# Patient Record
Sex: Male | Born: 1955 | Race: White | Hispanic: No | Marital: Married | Smoking: Former smoker
Health system: Southern US, Community
[De-identification: ages and names within clinical notes are randomized; demographics above are authoritative.]

## PROBLEM LIST (undated history)

## (undated) DIAGNOSIS — E7800 Pure hypercholesterolemia, unspecified: Secondary | ICD-10-CM

## (undated) DIAGNOSIS — C140 Malignant neoplasm of pharynx, unspecified: Secondary | ICD-10-CM

## (undated) DIAGNOSIS — Z8619 Personal history of other infectious and parasitic diseases: Secondary | ICD-10-CM

## (undated) DIAGNOSIS — K219 Gastro-esophageal reflux disease without esophagitis: Secondary | ICD-10-CM

## (undated) HISTORY — PX: COLONOSCOPY WITH PROPOFOL: SHX5780

## (undated) HISTORY — DX: Malignant neoplasm of pharynx, unspecified: C14.0

## (undated) HISTORY — DX: Personal history of other infectious and parasitic diseases: Z86.19

---

## 2009-02-16 ENCOUNTER — Ambulatory Visit: Payer: Self-pay | Admitting: Gastroenterology

## 2009-09-06 ENCOUNTER — Emergency Department: Payer: Self-pay | Admitting: Unknown Physician Specialty

## 2009-09-06 IMAGING — CR DG LUMBAR SPINE 2-3V
1 series · 3 of 3 positions shown · non-contrast
Comparison: none

REASON FOR EXAM: fell out of tree    injury  Flex 5
COMMENTS:   LMP: (Male)

[Series 1: view not recorded · 0.17mm/px · 3 of 3 slices shown]
[im 1/3]
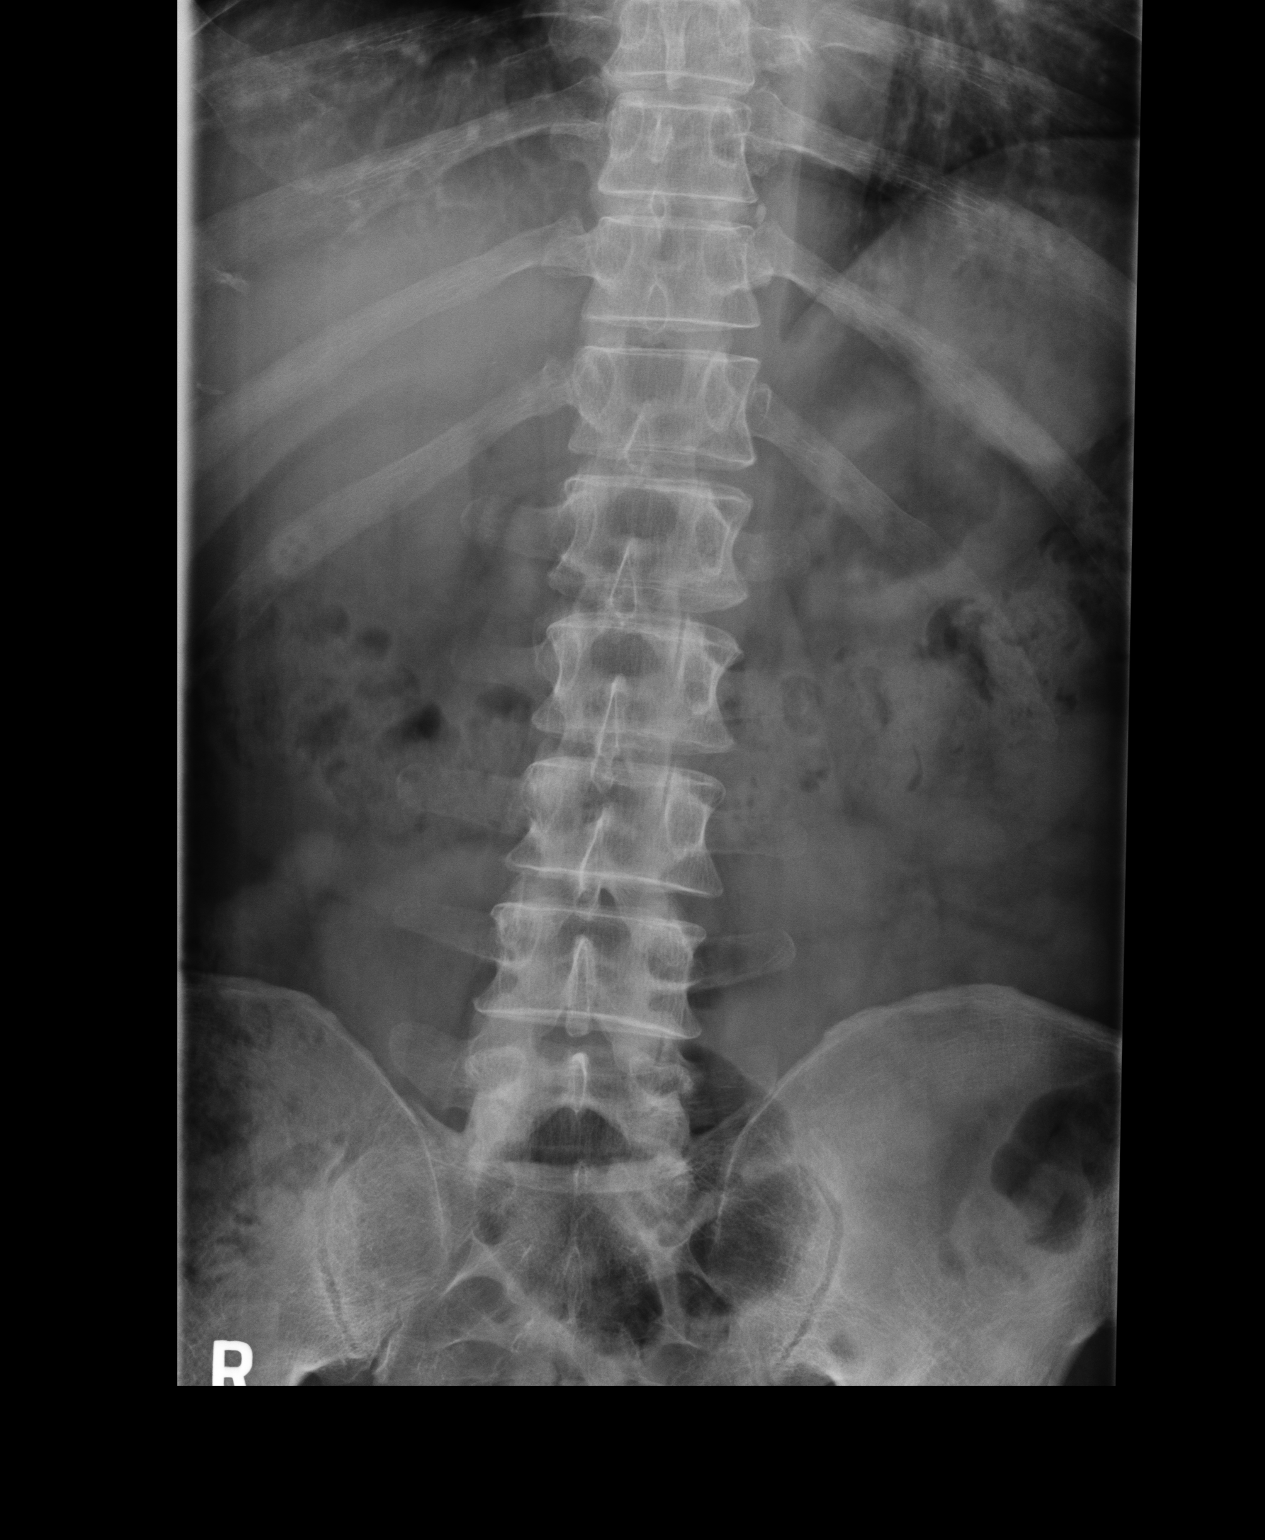
[im 2/3]
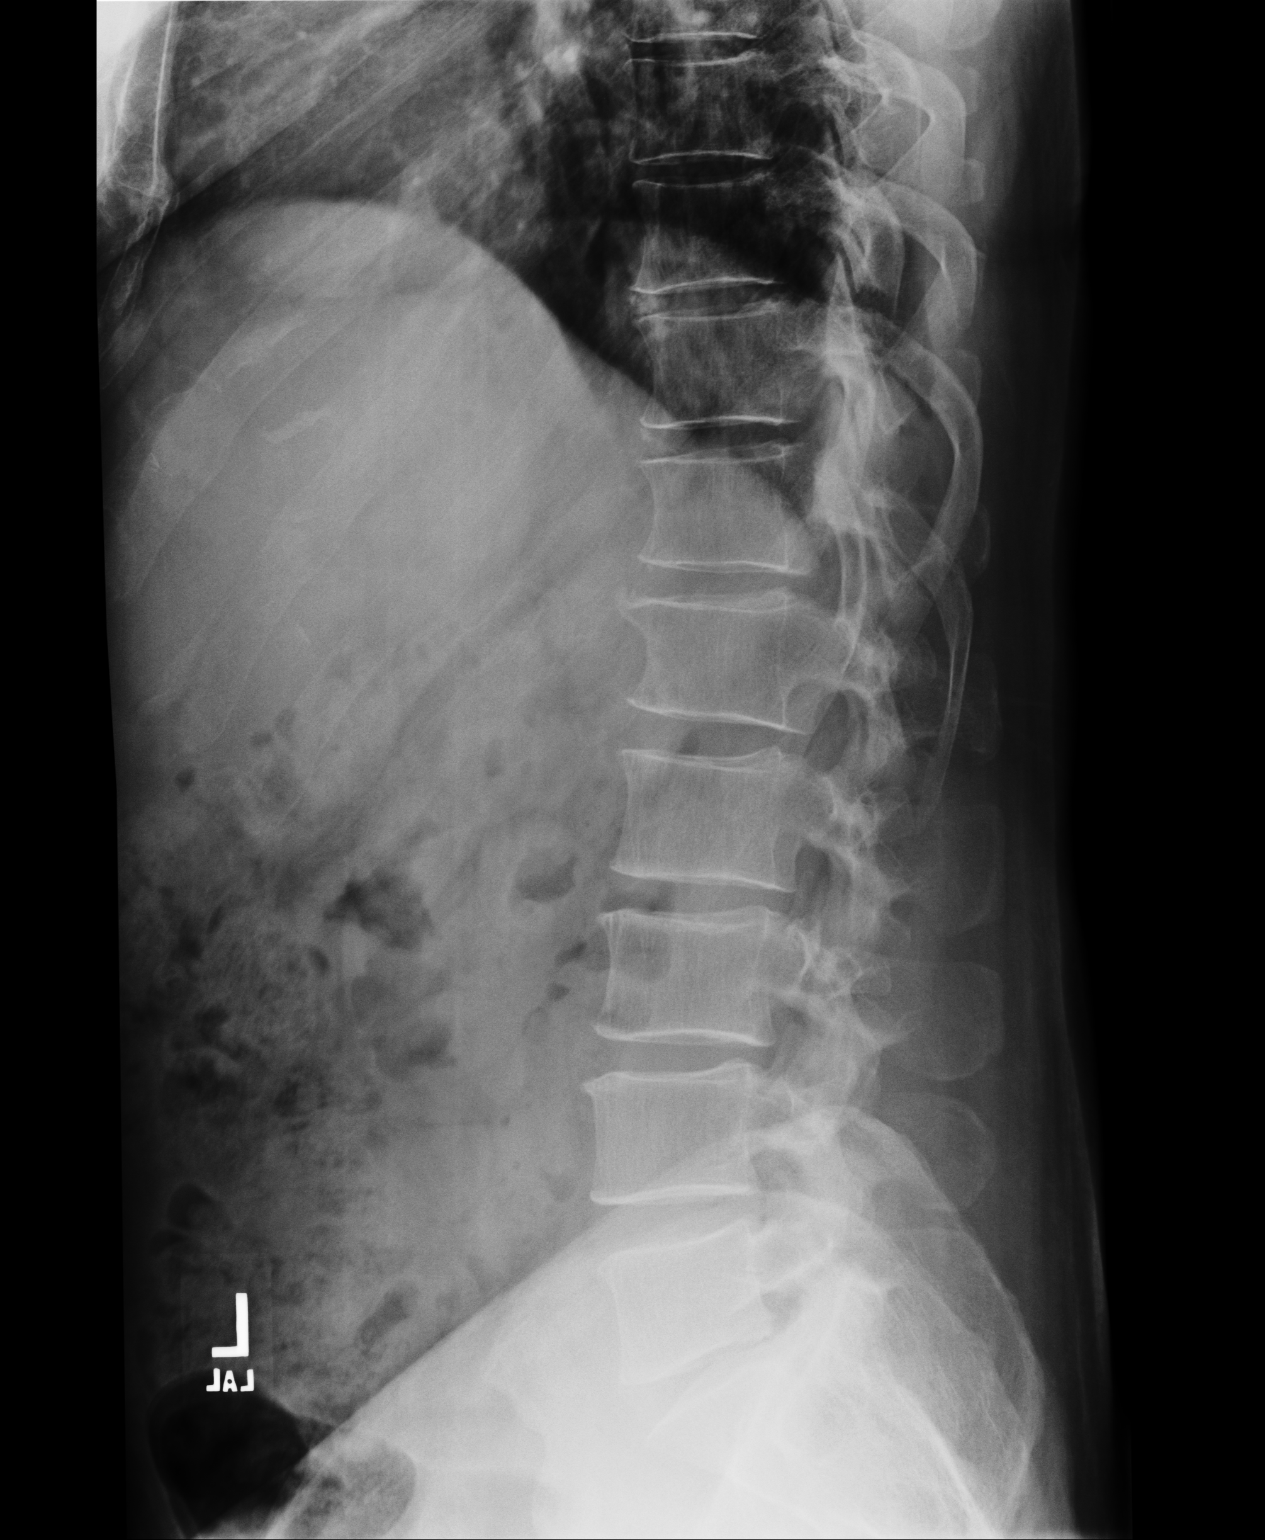
[im 3/3]
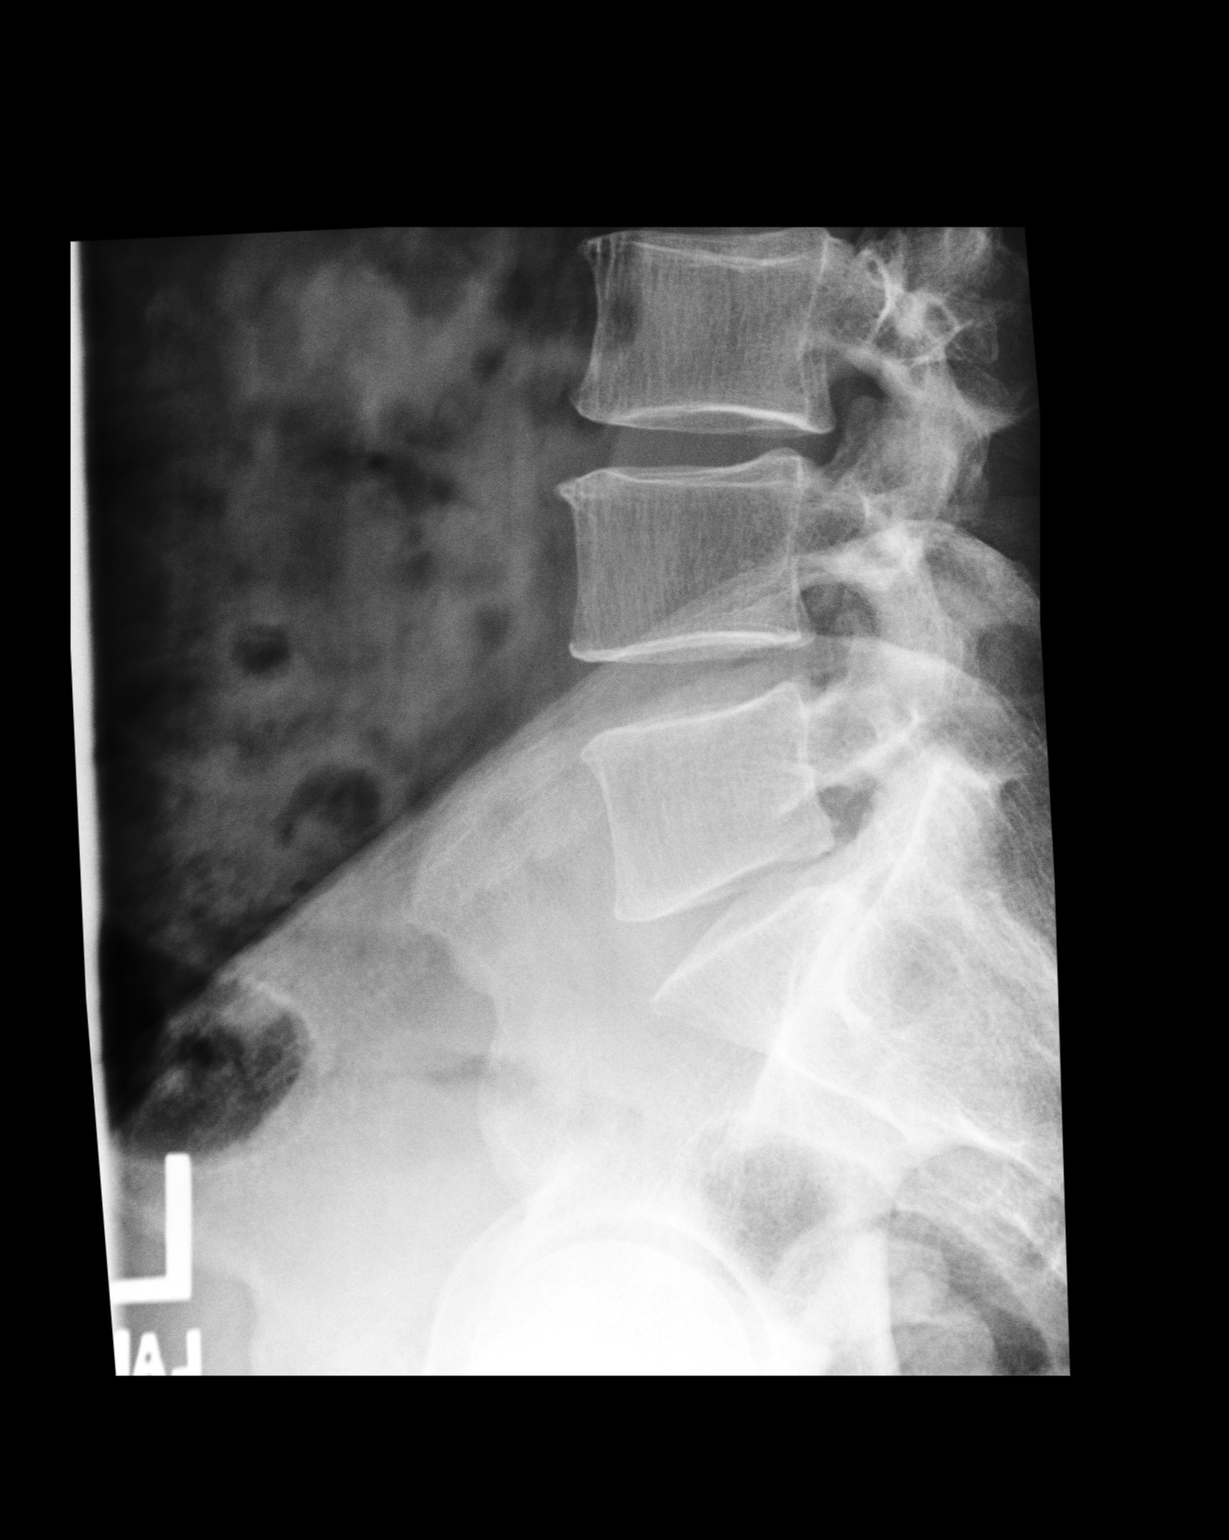

[3 of 3 positions shown; findings below may reference images not displayed]

PROCEDURE:     DXR - DXR LUMBAR SPINE AP AND LATERAL  - [DATE] [DATE]

RESULT:     The lumbar vertebral bodies are preserved in height with the
exception of L1 where there is very minimal anterior wedging. The
intervertebral disc space heights are well-maintained. I see no evidence of
retropulsed bony fragments.
IMPRESSION: There is very minimal wedging of the body of L1 anteriorly.
The examination otherwise is within the limits of normal for age.

## 2009-09-06 IMAGING — CR DG SHOULDER 3+V*R*
1 series · 3 of 3 positions shown · non-contrast
Comparison: none

REASON FOR EXAM: fell out of tree   injury   Flex 5
COMMENTS:   LMP: (Male)

[Series 1: view not recorded · 0.17mm/px · 3 of 3 slices shown]
[im 1/3]
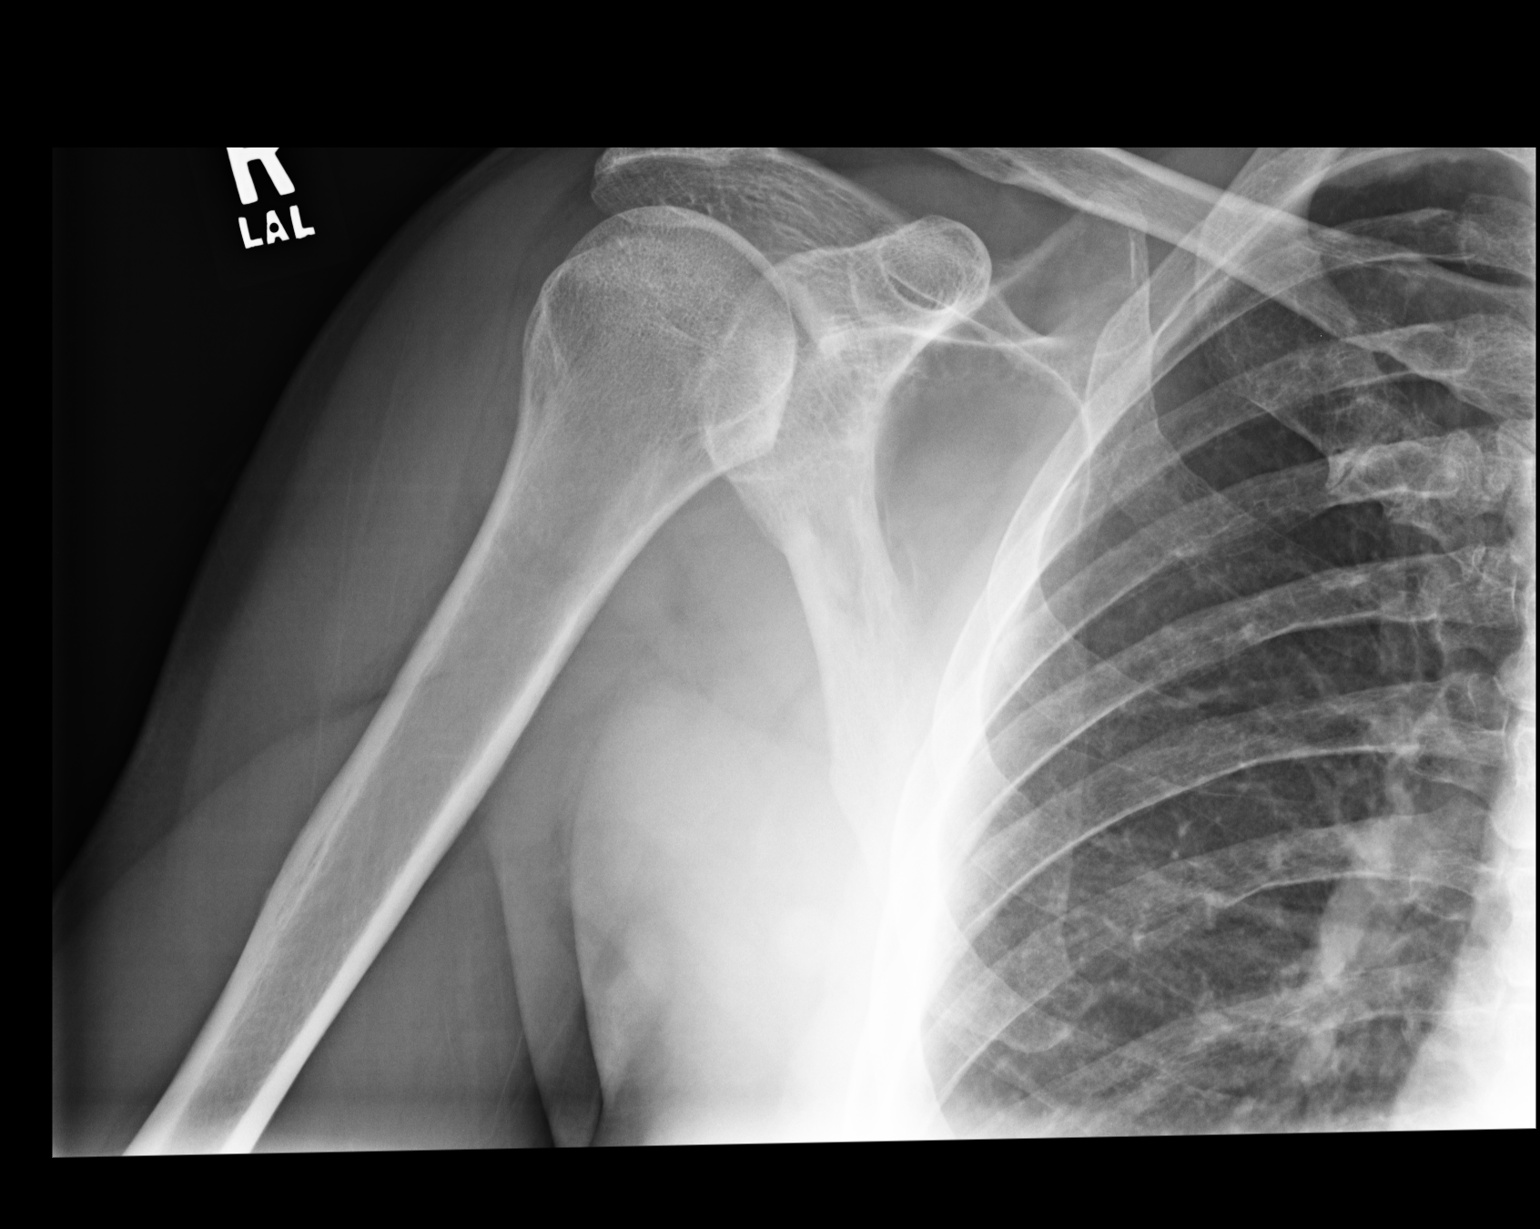
[im 2/3]
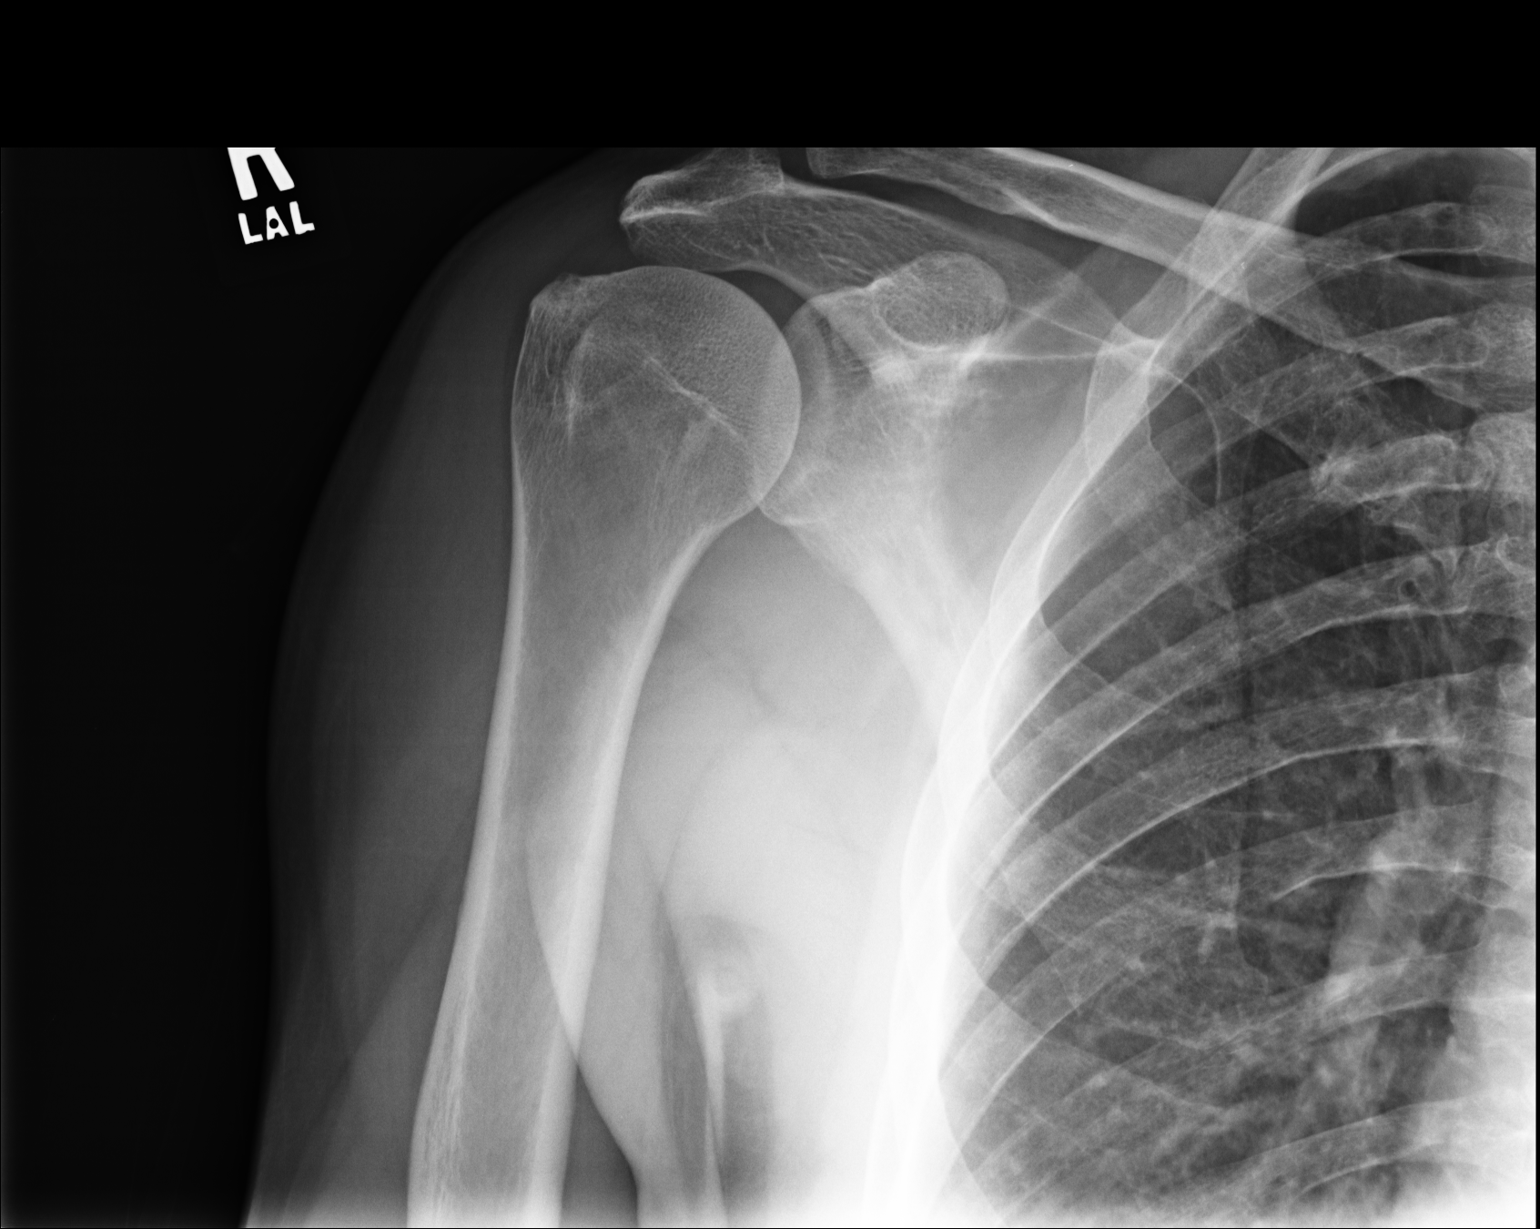
[im 3/3]
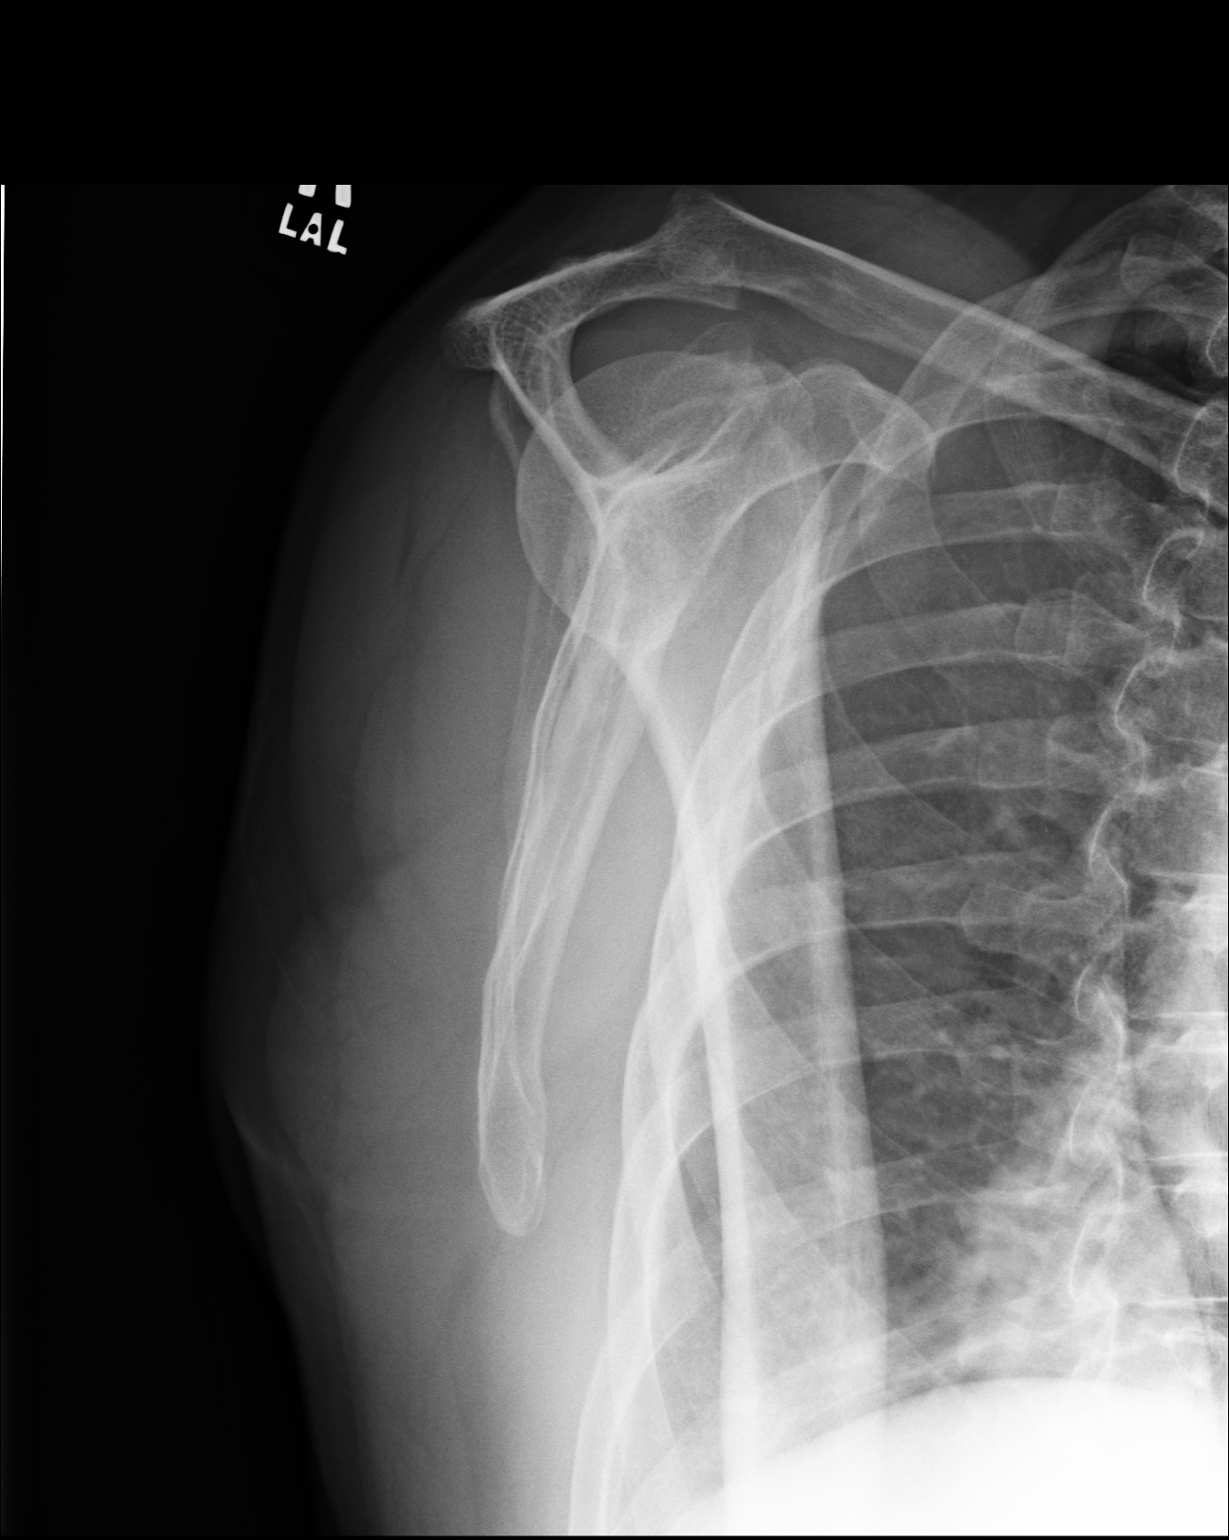

[3 of 3 positions shown; findings below may reference images not displayed]

PROCEDURE:     DXR - DXR SHOULDER RIGHT COMPLETE  - [DATE] [DATE]

RESULT:     Three views of the right shoulder reveal the bones to be
adequately mineralized. There is no evidence of an acute fracture nor
dislocation. The AC joint does not appear abnormally widened but mild
degenerative change of the AC joint is noted. The observed portions of the
scapula and clavicle appear intact.
IMPRESSION: I do not see evidence of acute fracture of the right
shoulder. If there are clinical concerns of AC joint injury, films obtained
without and with weights would be of value.

## 2009-09-06 IMAGING — CR DG WRIST COMPLETE 3+V*R*
1 series · 4 of 4 positions shown · non-contrast
Comparison: none

REASON FOR EXAM: fell out of tree/ injury   Flex 5
COMMENTS:   LMP: (Male)

[Series 1: view not recorded · 0.17mm/px · 4 of 4 slices shown]
[im 1/4]
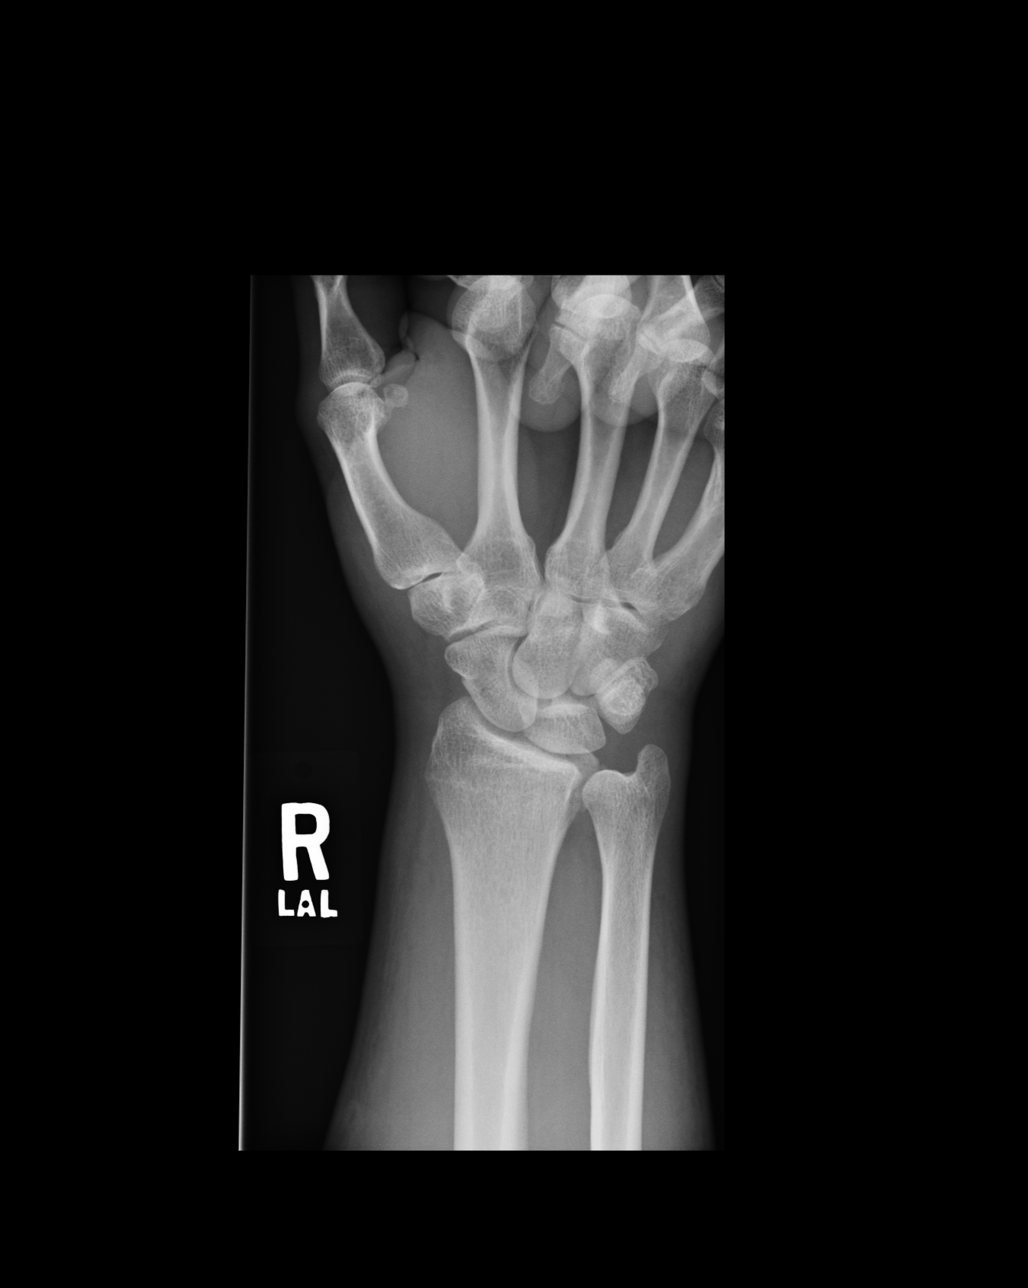
[im 2/4]
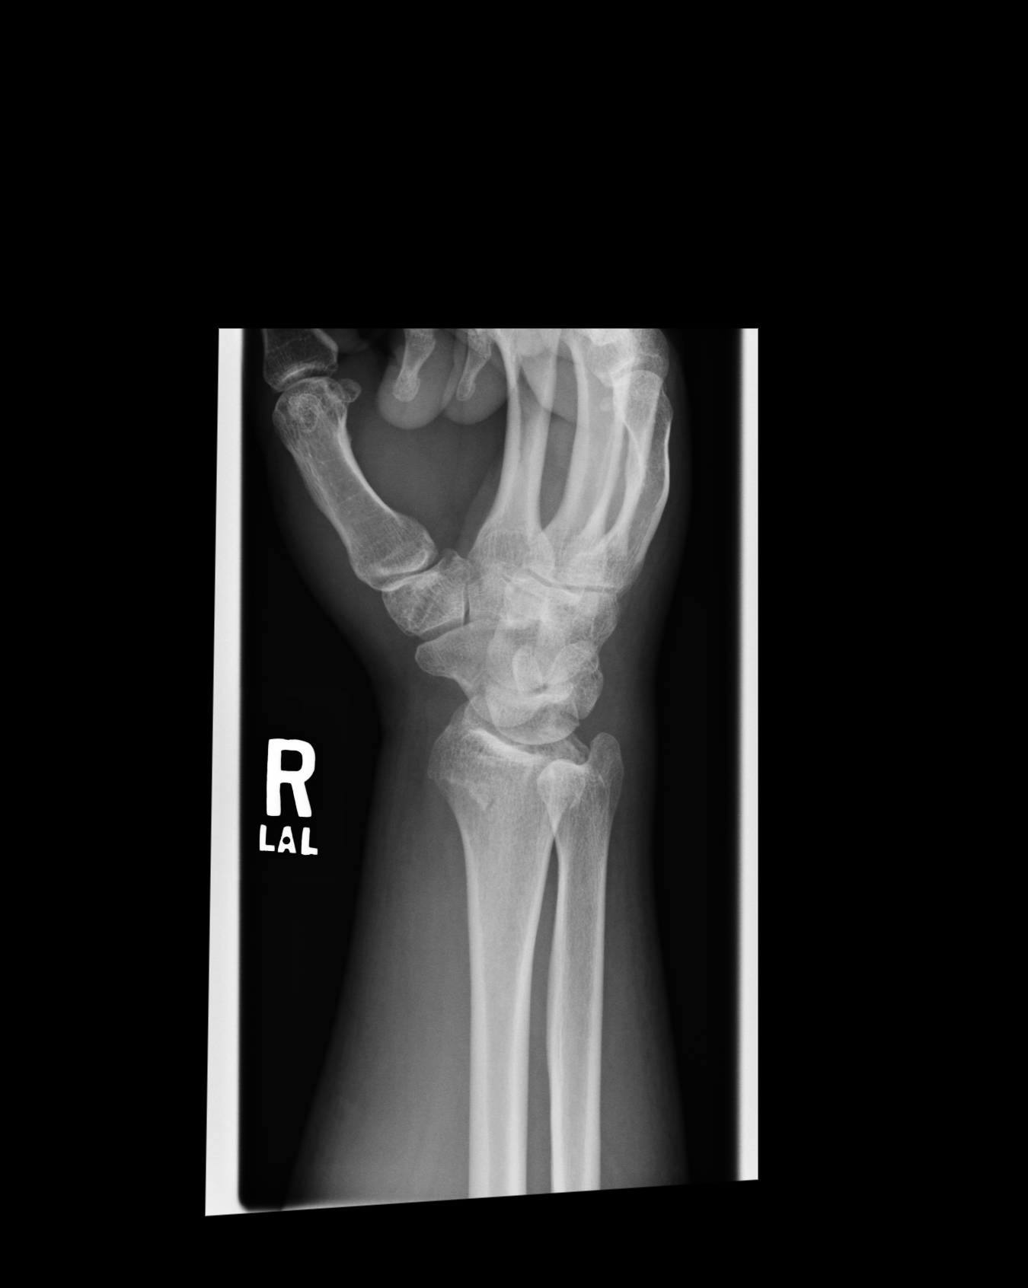
[im 3/4]
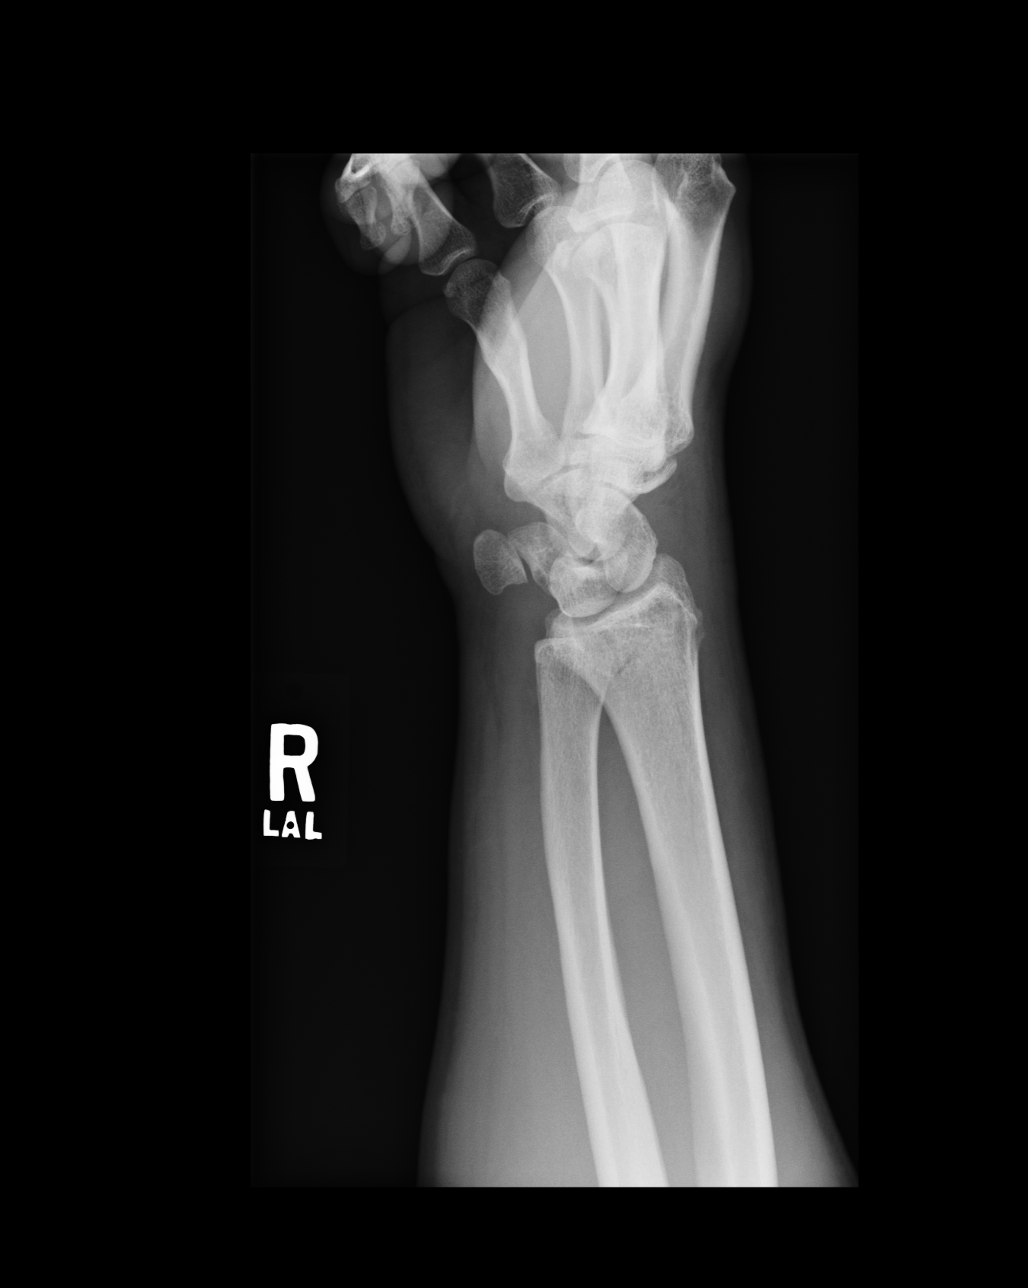
[im 4/4]
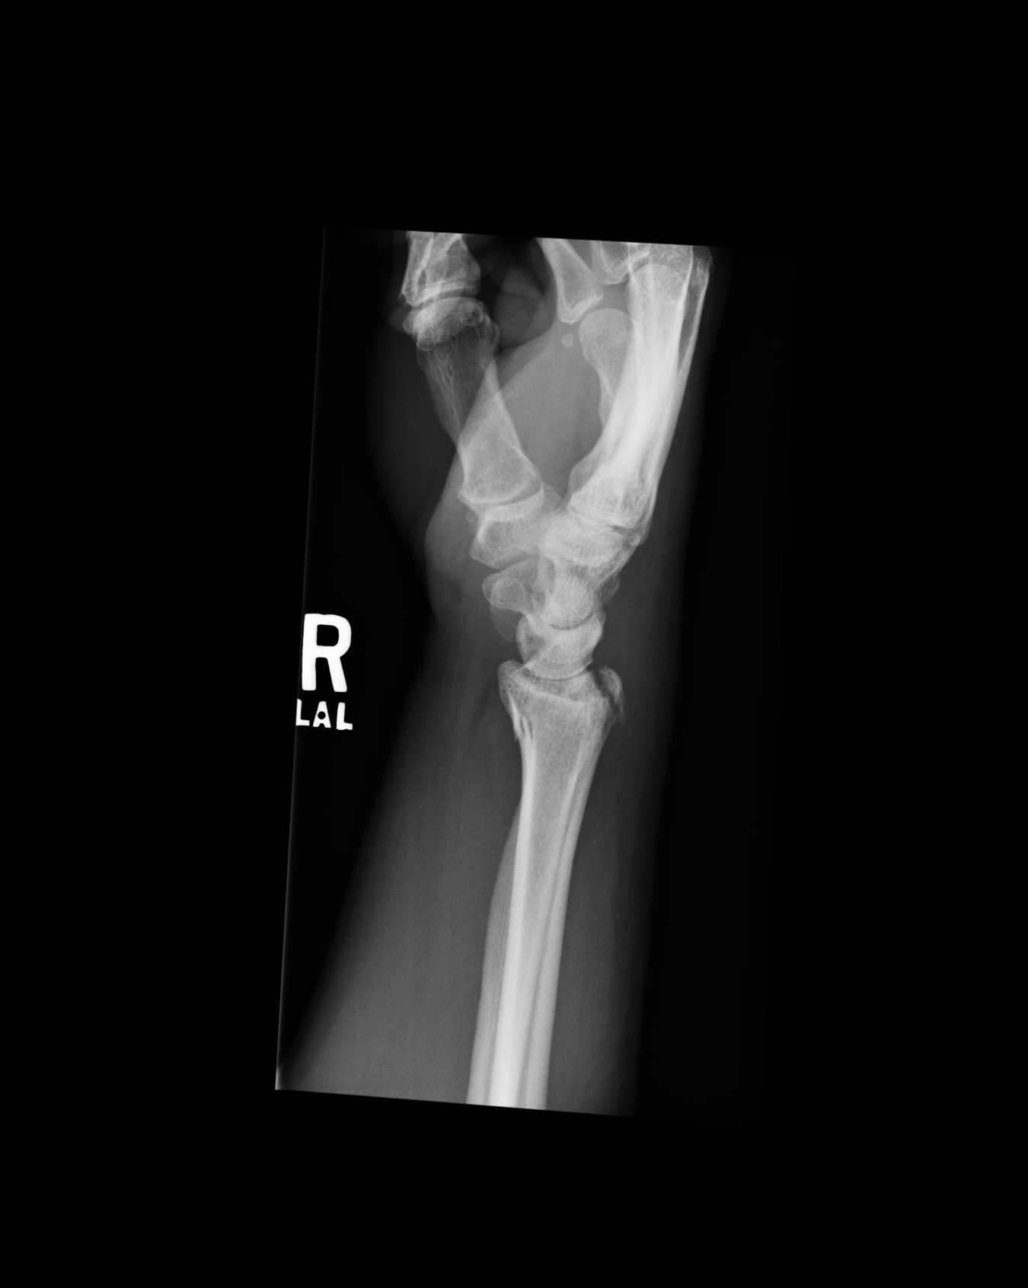

[4 of 4 positions shown; findings below may reference images not displayed]

PROCEDURE:     DXR - DXR WRIST RT COMP WITH OBLIQUES  - [DATE] [DATE]

RESULT:     Four views of the right wrist reveal a fracture involving the
distal radial metaphysis. There is no definite fracture of the adjacent
ulna. The carpal bones are grossly intact. There is an old fracture of the
base of the fifth metacarpal.
IMPRESSION: The patient has sustained a fracture through the distal
radial metaphysis. It is comminuted and there is a avulsion of a portion of
the dorsal aspect of the metaphysis.

## 2012-01-08 IMAGING — CR RIGHT FOOT COMPLETE - 3+ VIEW
1 series · 3 of 3 positions shown · non-contrast
Comparison: none

REASON FOR EXAM: painful, swelling, fall
COMMENTS:

PROCEDURE:     DXR - DXR FOOT RT COMPLETE W/OBLIQUES  - [DATE]  [DATE]
RESULT:     Images of the right foot demonstrate no evidence of fracture,
dislocation or radiopaque foreign body.

[Series 1: x foot ap right · 0.14mm/px · 3 of 3 slices shown]
[im 1/3]
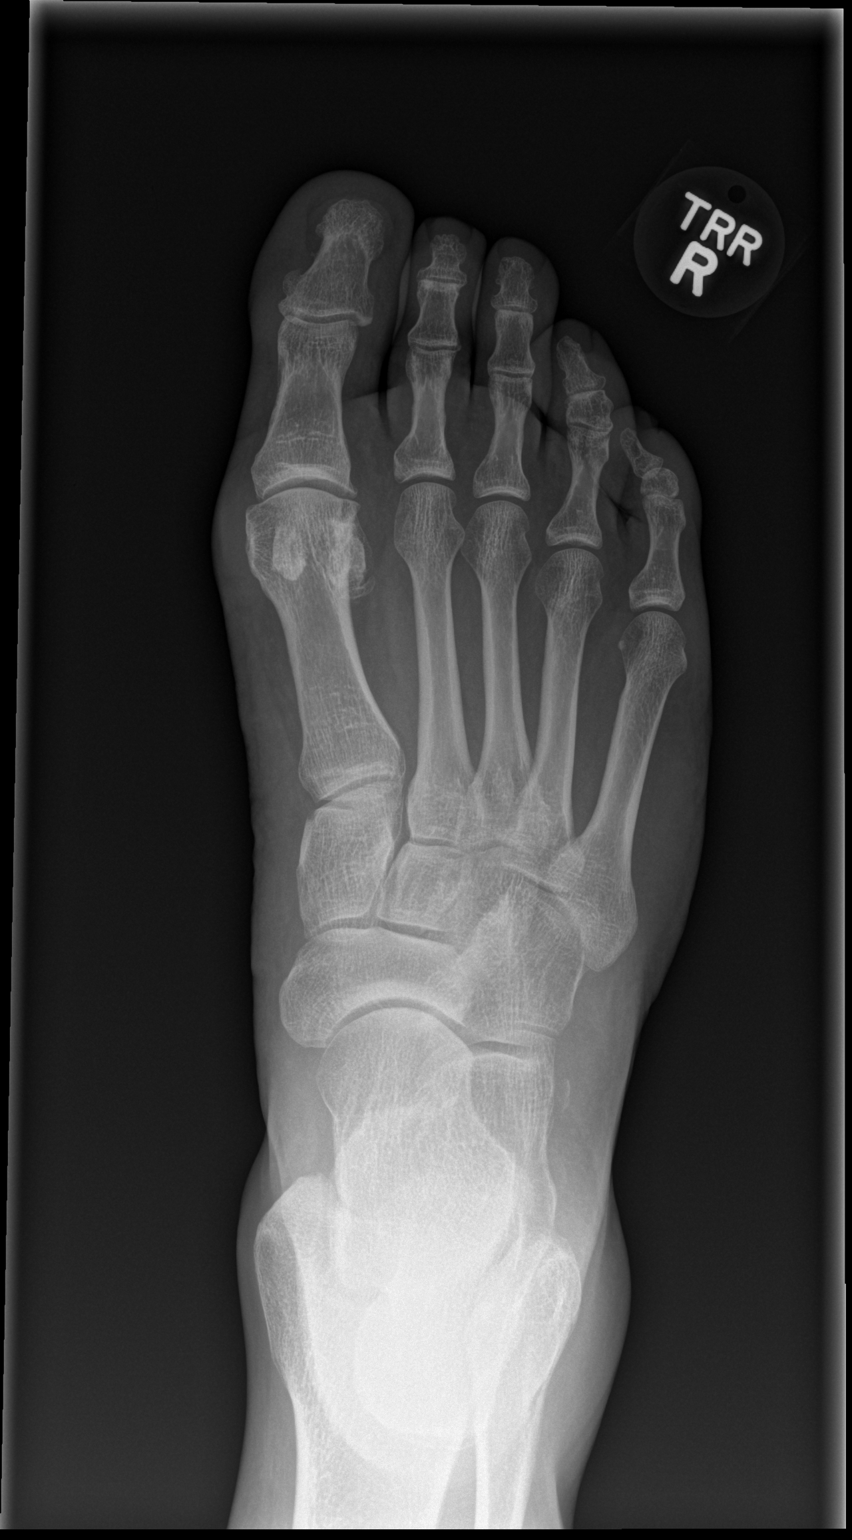
[im 2/3]
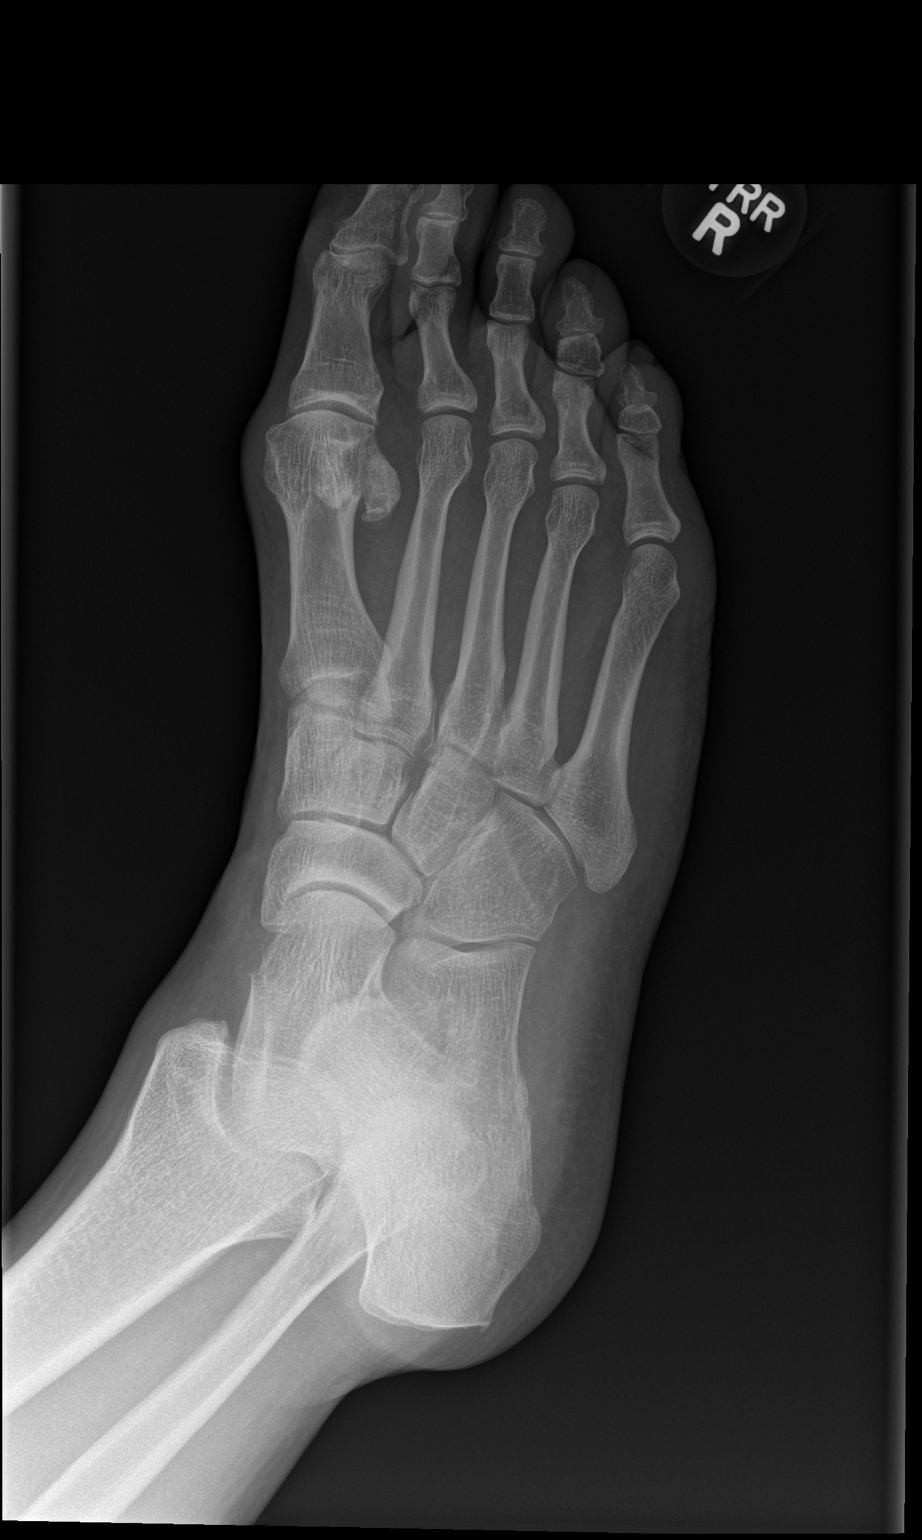
[im 3/3]
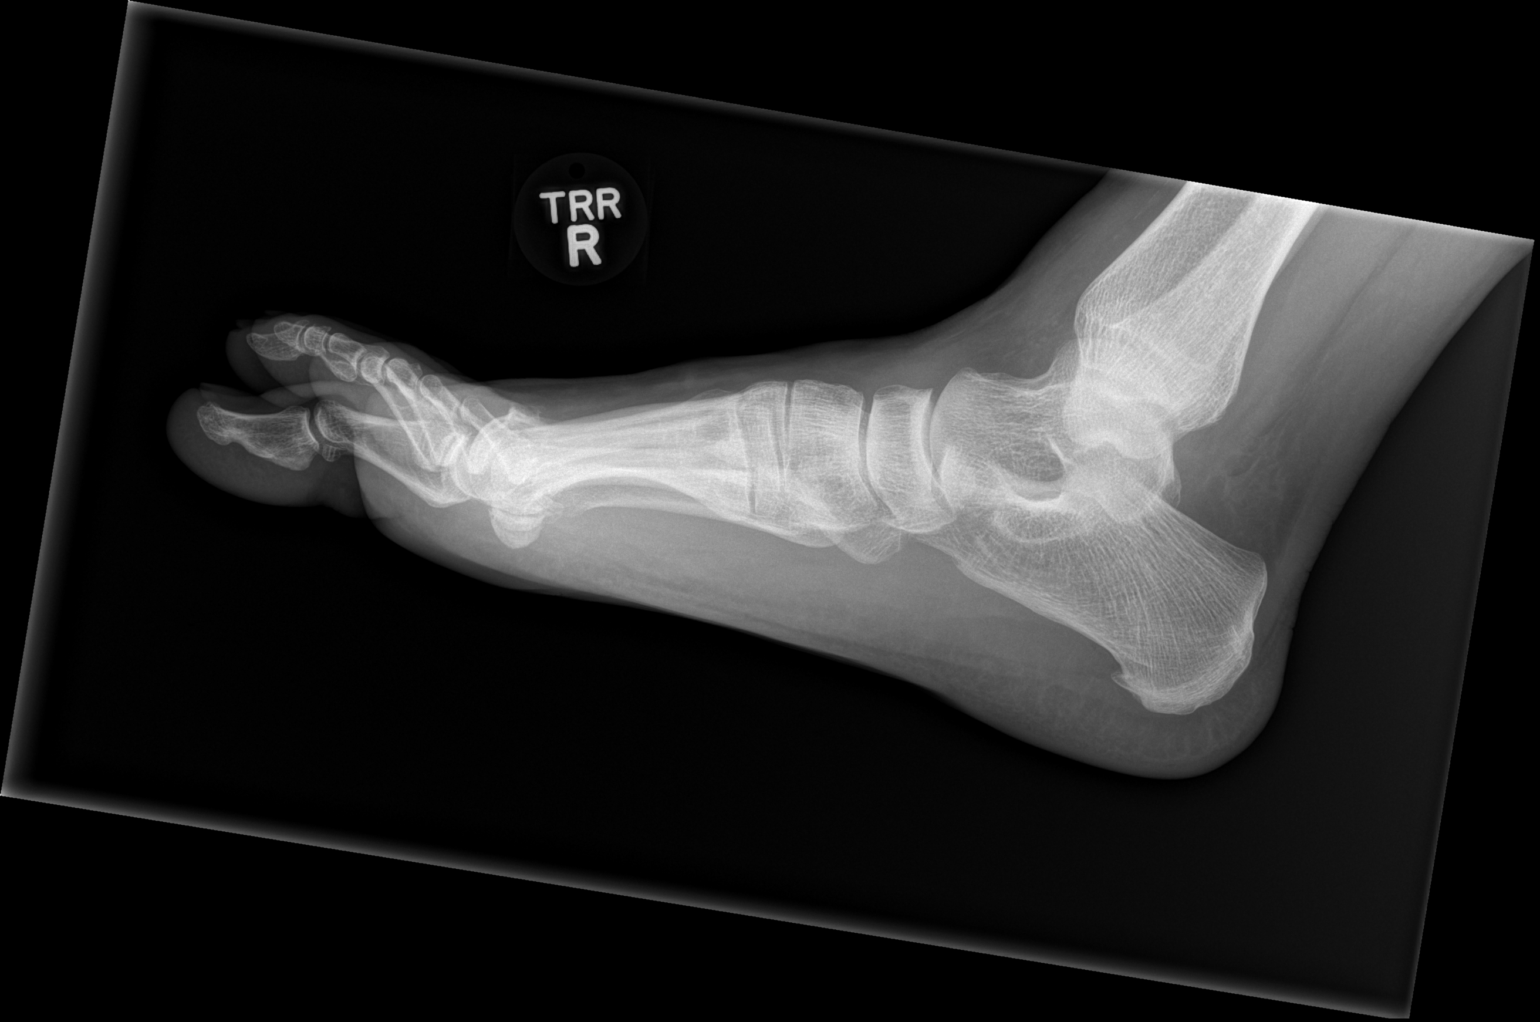

[3 of 3 positions shown; findings below may reference images not displayed]

IMPRESSION: Please see above.

[REDACTED]

## 2012-12-07 ENCOUNTER — Emergency Department: Payer: Self-pay | Admitting: Emergency Medicine

## 2012-12-07 IMAGING — CR RIGHT ANKLE - 2 VIEW
1 series · 2 of 2 positions shown · non-contrast
Comparison: none

REASON FOR EXAM: stepped in hole, pain swelling
COMMENTS:

PROCEDURE:     DXR - DXR ANKLE RIGHT AP AND LATERAL  - [DATE] [DATE]
RESULT:     Right ankle images demonstrate no fracture, dislocation or
radiopaque foreign body. There is some degenerative spurring in the plantar
region of the calcaneus.

[Series 1: ap · 0.17mm/px · 2 of 2 slices shown]
[im 1/2]
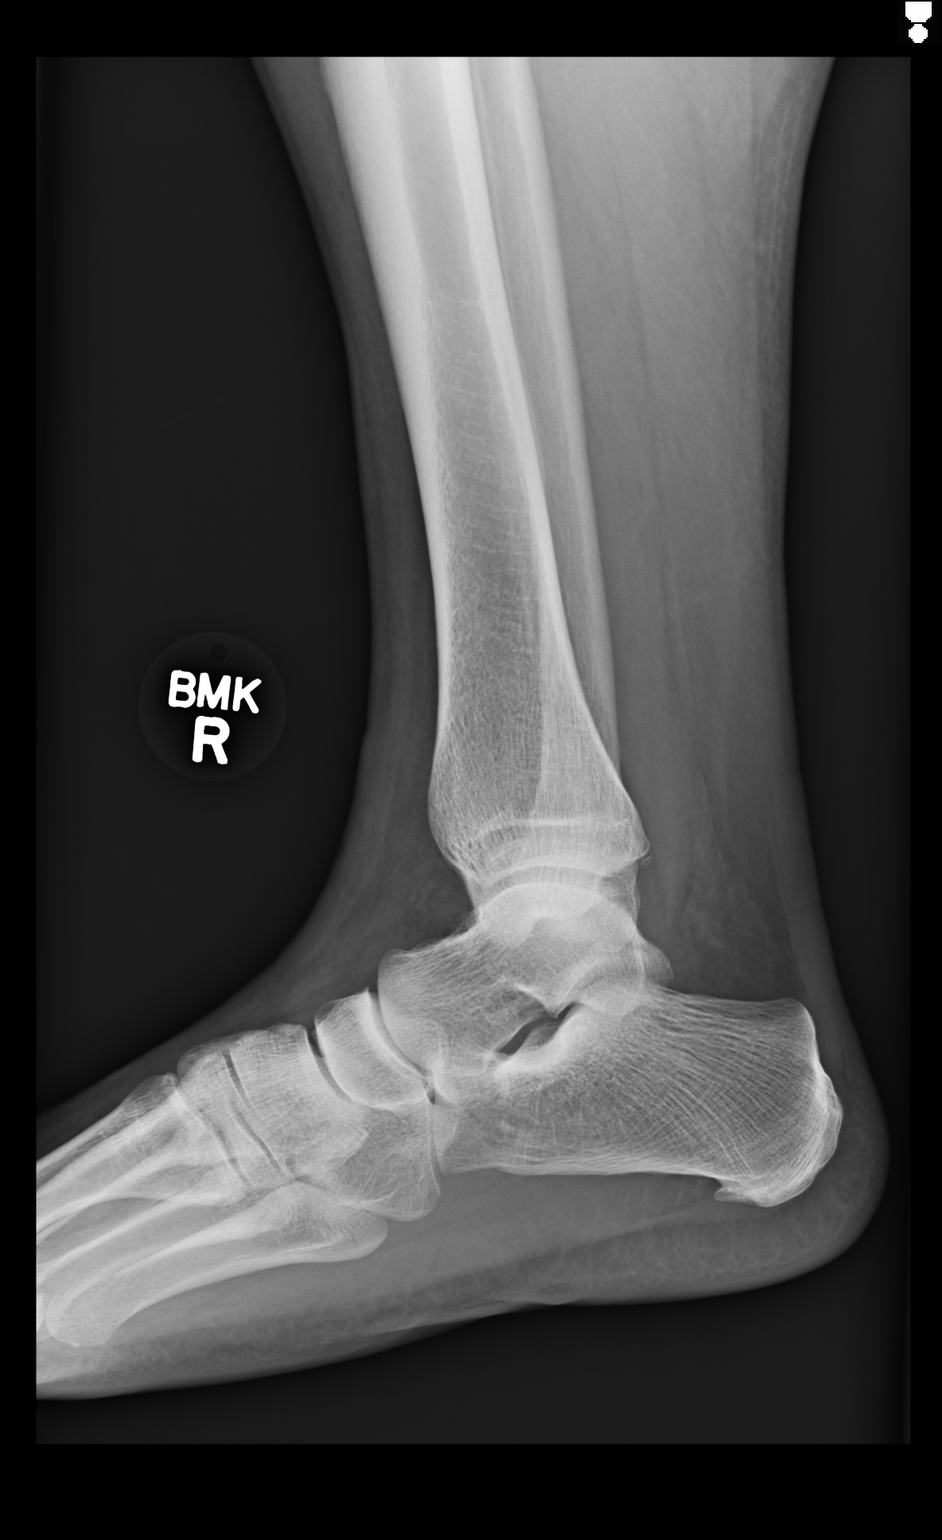
[im 2/2]
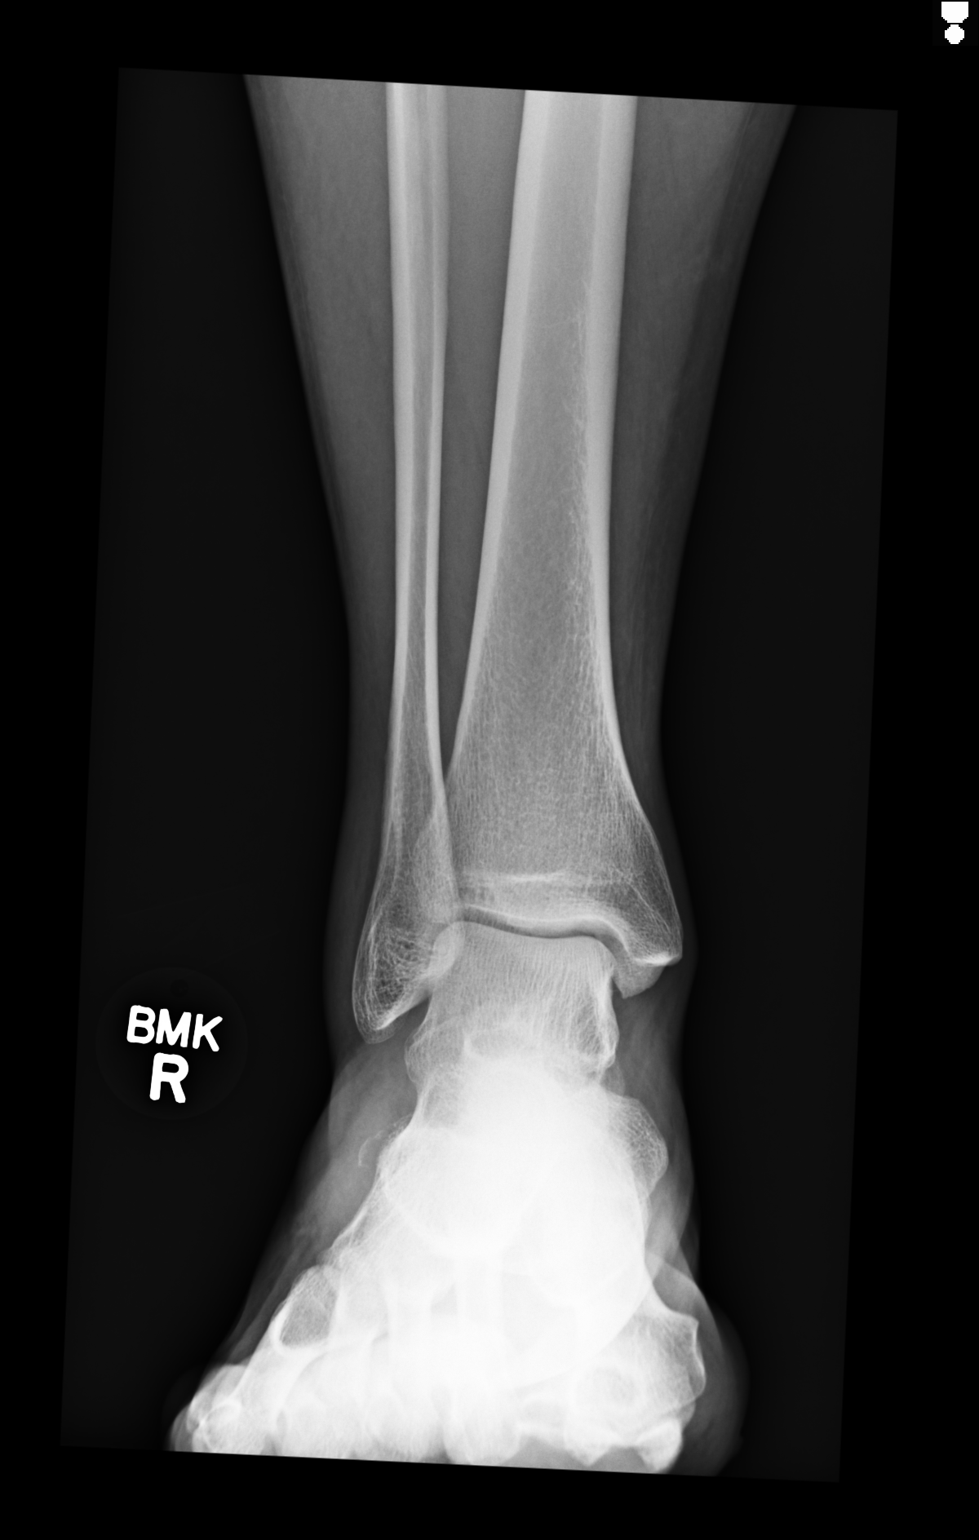

[2 of 2 positions shown; findings below may reference images not displayed]

IMPRESSION: Please see above.

[REDACTED]

## 2013-02-19 ENCOUNTER — Encounter: Payer: Self-pay | Admitting: *Deleted

## 2013-02-22 ENCOUNTER — Encounter: Payer: Self-pay | Admitting: Cardiovascular Disease

## 2013-02-22 ENCOUNTER — Ambulatory Visit (INDEPENDENT_AMBULATORY_CARE_PROVIDER_SITE_OTHER): Payer: BC Managed Care – PPO | Admitting: Cardiovascular Disease

## 2013-02-22 VITALS — BP 114/84 | HR 48 | Ht 68.5 in | Wt 184.2 lb

## 2013-02-22 DIAGNOSIS — R0602 Shortness of breath: Secondary | ICD-10-CM

## 2013-02-22 DIAGNOSIS — R079 Chest pain, unspecified: Secondary | ICD-10-CM | POA: Insufficient documentation

## 2013-02-22 DIAGNOSIS — R000 Tachycardia, unspecified: Secondary | ICD-10-CM

## 2013-02-22 NOTE — Patient Instructions (Addendum)
[facility] MYOVIEW  Your caregiver has ordered a Stress Test with nuclear imaging. The purpose of this test is to evaluate the blood supply to your heart muscle. This procedure is referred to as a "Non-Invasive Stress Test." This is because other than having an IV started in your vein, nothing is inserted or "invades" your body. Cardiac stress tests are done to find areas of poor blood flow to the heart by determining the extent of coronary artery disease (CAD). Some patients exercise on a treadmill, which naturally increases the blood flow to your heart, while others who are  unable to walk on a treadmill due to physical limitations have a pharmacologic/chemical stress agent called Lexiscan . This medicine will mimic walking on a treadmill by temporarily increasing your coronary blood flow.   Please note: these test may take anywhere between 2-4 hours to complete  PLEASE REPORT TO [facility] MEDICAL MALL ENTRANCE  THE VOLUNTEERS AT THE FIRST DESK WILL DIRECT YOU WHERE TO GO  Date of Procedure:________FRIDAY, OCT 3_____________________________  Arrival Time for Procedure:_______7:30 am_______________________   PLEASE NOTIFY THE OFFICE AT LEAST 24 HOURS IN ADVANCE IF YOU ARE UNABLE TO KEEP YOUR APPOINTMENT.  [phone] AND  PLEASE NOTIFY NUCLEAR MEDICINE AT [facility] AT LEAST 24 HOURS IN ADVANCE IF YOU ARE UNABLE TO KEEP YOUR APPOINTMENT. [phone]  How to prepare for your Myoview test:  1. Do not eat or drink after midnight 2. No caffeine for 24 hours prior to test 3. No smoking 24 hours prior to test. 4. Your medication may be taken with water.  If your doctor stopped a medication because of this test, do not take that medication. 5. Ladies, please do not wear dresses.  Skirts or pants are appropriate. Please wear a short sleeve shirt. 6. No perfume, cologne or lotion. 7. Wear comfortable walking shoes. No heels!

## 2013-02-22 NOTE — Assessment & Plan Note (Signed)
Mild symptoms associated with significant shortness of breath on exertion. Myoview ordered

## 2013-02-22 NOTE — Progress Notes (Signed)
Patient ID: Andrew Mendoza, male    DOB: [birth year], 56 y.o.   MRN: [id number]  HPI Comments: Mr. Andrew Mendoza is a very pleasant 57 year old gentleman with previous history of smoking for 10 years who stopped 35 years ago, presenting with worsening shortness of breath over the past 4-5 months. Notes from his primary care physician detailing history of throat cancer in 1997 with radiation treatments at that time  He reports that he works for the [employer], drives a truck, is generally very active. His girlfriend has noticed that he has had increasing shortness of breath started 5 months ago. He was particularly noticeable when they walk together at the beach in August 2014. Climbing stairs, he had to stop secondary to symptoms of shortness of breath. She did not have significant symptoms. She reports that these symptoms are new for him. Also had symptoms with hills, lifting items, running. Denies any lower extremity edema, no lightheadedness or dizziness.  Chest can be tight with his shortness of breath, relieved with rest. He denies any PND or orthopnea.  He does report his brother has coronary artery disease, recent multiple stent placement at age 45, father died of stroke at age 87, mother died at 73 from unrelated causes  EKG shows marked sinus bradycardia with rate 48 beats per minute with no significant ST or T wave change Lab work from 2010 shows total cholesterol 240, LDL 160, HDL 56   Outpatient Encounter Prescriptions as of 02/22/2013  Medication Sig Dispense Refill  . aspirin 81 MG tablet Take 81 mg by mouth daily.      . Multiple Vitamin (MULTIVITAMIN) tablet Take 1 tablet by mouth daily.      . Omega-3 Fatty Acids (FISH OIL) 600 MG CAPS Take by mouth daily.      Marland Kitchen omeprazole (PRILOSEC) 40 MG capsule Take 40 mg by mouth daily.       . vitamin E (VITAMIN E) 400 UNIT capsule Take 800 Units by mouth daily.       No facility-administered encounter medications on file as of 02/22/2013.     Review  of Systems  Constitutional: Negative.   HENT: Negative.   Eyes: Negative.   Respiratory: Positive for shortness of breath.   Cardiovascular: Negative.   Gastrointestinal: Negative.   Endocrine: Negative.   Musculoskeletal: Negative.   Skin: Negative.   Allergic/Immunologic: Negative.   Neurological: Negative.   Hematological: Negative.   Psychiatric/Behavioral: Negative.   All other systems reviewed and are negative.    BP 114/84  Pulse 48  Ht 5' 8.5" (1.74 m)  Wt 184 lb 4 oz (83.575 kg)  BMI 27.6 kg/m2  Physical Exam  Nursing note and vitals reviewed. Constitutional: He is oriented to person, place, and time. He appears well-developed and well-nourished.  HENT:  Head: Normocephalic.  Nose: Nose normal.  Mouth/Throat: Oropharynx is clear and moist.  Eyes: Conjunctivae are normal. Pupils are equal, round, and reactive to light.  Neck: Normal range of motion. Neck supple. No JVD present.  Cardiovascular: Normal rate, regular rhythm, S1 normal, S2 normal, normal heart sounds and intact distal pulses.  Exam reveals no gallop and no friction rub.   No murmur heard. Pulmonary/Chest: Effort normal and breath sounds normal. No respiratory distress. He has no wheezes. He has no rales. He exhibits no tenderness.  Abdominal: Soft. Bowel sounds are normal. He exhibits no distension. There is no tenderness.  Musculoskeletal: Normal range of motion. He exhibits no edema and no tenderness.  Lymphadenopathy:  He has no cervical adenopathy.  Neurological: He is alert and oriented to person, place, and time. Coordination normal.  Skin: Skin is warm and dry. No rash noted. No erythema.  Psychiatric: He has a normal mood and affect. His behavior is normal. Judgment and thought content normal.      Assessment and Plan

## 2013-02-22 NOTE — Assessment & Plan Note (Signed)
His predominant symptom is shortness of breath with exertion. Unable to exclude ischemia given his previous smoking history, significant hyperlipidemia. He does have strong family history with brother with known coronary disease, recent stenting, father who died of stroke. We will schedule him for a treadmill Myoview.

## 2013-03-01 ENCOUNTER — Other Ambulatory Visit: Payer: Self-pay

## 2013-03-01 ENCOUNTER — Ambulatory Visit: Payer: Self-pay | Admitting: Cardiovascular Disease

## 2013-03-01 DIAGNOSIS — R0602 Shortness of breath: Secondary | ICD-10-CM

## 2013-03-01 DIAGNOSIS — R079 Chest pain, unspecified: Secondary | ICD-10-CM

## 2013-03-13 ENCOUNTER — Telehealth: Payer: Self-pay

## 2013-03-13 NOTE — Telephone Encounter (Signed)
Pt would like stress test results.  

## 2013-03-14 NOTE — Telephone Encounter (Signed)
Left detailed message that stress test was normal.  Asked pt to call with any questions or concerns.

## 2013-04-04 ENCOUNTER — Other Ambulatory Visit: Payer: Self-pay

## 2019-05-09 ENCOUNTER — Other Ambulatory Visit
Admission: RE | Admit: 2019-05-09 | Discharge: 2019-05-09 | Disposition: A | Discharge status: Home / Self Care | Payer: 59 | Source: Ambulatory Visit | Attending: Internal Medicine | Admitting: Internal Medicine

## 2019-05-09 DIAGNOSIS — Z01812 Encounter for preprocedural laboratory examination: Secondary | ICD-10-CM | POA: Insufficient documentation

## 2019-05-09 DIAGNOSIS — Z20828 Contact with and (suspected) exposure to other viral communicable diseases: Secondary | ICD-10-CM | POA: Insufficient documentation

## 2019-05-10 ENCOUNTER — Encounter: Payer: Self-pay | Admitting: Internal Medicine

## 2019-05-10 LAB — SARS CORONAVIRUS 2 (TAT 6-24 HRS): SARS Coronavirus 2: NEGATIVE

## 2019-05-13 ENCOUNTER — Other Ambulatory Visit: Payer: Self-pay

## 2019-05-13 ENCOUNTER — Encounter
Admission: RE | Disposition: A | Discharge status: Home / Self Care | Payer: Self-pay | Source: Home / Self Care | Attending: Internal Medicine

## 2019-05-13 ENCOUNTER — Ambulatory Visit
Admission: RE | Admit: 2019-05-13 | Discharge: 2019-05-13 | Disposition: A | Discharge status: Home / Self Care | Payer: No Typology Code available for payment source | Attending: Internal Medicine | Admitting: Internal Medicine

## 2019-05-13 ENCOUNTER — Ambulatory Visit: Payer: No Typology Code available for payment source | Admitting: Anesthesiology

## 2019-05-13 ENCOUNTER — Encounter: Payer: Self-pay | Admitting: Internal Medicine

## 2019-05-13 DIAGNOSIS — Z79899 Other long term (current) drug therapy: Secondary | ICD-10-CM | POA: Diagnosis not present

## 2019-05-13 DIAGNOSIS — E7800 Pure hypercholesterolemia, unspecified: Secondary | ICD-10-CM | POA: Diagnosis not present

## 2019-05-13 DIAGNOSIS — K640 First degree hemorrhoids: Secondary | ICD-10-CM | POA: Diagnosis not present

## 2019-05-13 DIAGNOSIS — K219 Gastro-esophageal reflux disease without esophagitis: Secondary | ICD-10-CM | POA: Diagnosis not present

## 2019-05-13 DIAGNOSIS — Z1211 Encounter for screening for malignant neoplasm of colon: Secondary | ICD-10-CM | POA: Diagnosis present

## 2019-05-13 DIAGNOSIS — K5730 Diverticulosis of large intestine without perforation or abscess without bleeding: Secondary | ICD-10-CM | POA: Diagnosis not present

## 2019-05-13 DIAGNOSIS — K228 Other specified diseases of esophagus: Secondary | ICD-10-CM | POA: Diagnosis not present

## 2019-05-13 DIAGNOSIS — Z7982 Long term (current) use of aspirin: Secondary | ICD-10-CM | POA: Diagnosis not present

## 2019-05-13 DIAGNOSIS — Z85819 Personal history of malignant neoplasm of unspecified site of lip, oral cavity, and pharynx: Secondary | ICD-10-CM | POA: Diagnosis not present

## 2019-05-13 DIAGNOSIS — Z87891 Personal history of nicotine dependence: Secondary | ICD-10-CM | POA: Diagnosis not present

## 2019-05-13 HISTORY — PX: ESOPHAGOGASTRODUODENOSCOPY (EGD) WITH PROPOFOL: SHX5813

## 2019-05-13 HISTORY — PX: COLONOSCOPY WITH PROPOFOL: SHX5780

## 2019-05-13 HISTORY — DX: Pure hypercholesterolemia, unspecified: E78.00

## 2019-05-13 HISTORY — DX: Gastro-esophageal reflux disease without esophagitis: K21.9

## 2019-05-13 SURGERY — ESOPHAGOGASTRODUODENOSCOPY (EGD) WITH PROPOFOL
Anesthesia: General

## 2019-05-13 MED ORDER — PROPOFOL 500 MG/50ML IV EMUL
INTRAVENOUS | Status: AC
  Filled 2019-05-13: qty 50

## 2019-05-13 MED ORDER — PROPOFOL 500 MG/50ML IV EMUL
INTRAVENOUS | Status: DC | PRN
  Administered 2019-05-13: 160 ug/kg/min via INTRAVENOUS

## 2019-05-13 MED ORDER — LIDOCAINE HCL (CARDIAC) PF 100 MG/5ML IV SOSY
PREFILLED_SYRINGE | INTRAVENOUS | Status: DC | PRN
  Administered 2019-05-13: 60 mg via INTRAVENOUS

## 2019-05-13 MED ORDER — PROPOFOL 10 MG/ML IV BOLUS
INTRAVENOUS | Status: DC | PRN
  Administered 2019-05-13: 60 mg via INTRAVENOUS
  Administered 2019-05-13 (×2): 20 mg via INTRAVENOUS

## 2019-05-13 MED ORDER — SODIUM CHLORIDE 0.9 % IV SOLN
INTRAVENOUS | Status: DC
  Administered 2019-05-13: 13:00:00 via INTRAVENOUS

## 2019-05-13 NOTE — Op Note (Signed)
[facility] Gastroenterology Patient Name: Andrew Mendoza Procedure Date: 05/13/2019 1:29 PM MRN: [id number] Account #: [id number] Date of Birth: 1956/04/28 Admit Type: Outpatient Age: 63 Room: [facility] ENDO ROOM 1 Gender: Male Note Status: Finalized Procedure:             Colonoscopy Indications:           Screening for colorectal malignant neoplasm Providers:             Benay Pike. Alice Reichert MD, MD Referring MD:          Irven Easterly. Kary Kos, MD (Referring MD) Medicines:             Propofol per Anesthesia Complications:         No immediate complications. Procedure:             Pre-Anesthesia Assessment:                        - The risks and benefits of the procedure and the                         sedation options and risks were discussed with the                         patient. All questions were answered and informed                         consent was obtained.                        - Patient identification and proposed procedure were                         verified prior to the procedure by the nurse. The                         procedure was verified in the procedure room.                        - ASA Grade Assessment: III - A patient with severe                         systemic disease.                        - ASA Grade Assessment: III - A patient with severe                         systemic disease.                        After obtaining informed consent, the colonoscope was                         passed under direct vision. Throughout the procedure,                         the patient's blood pressure, pulse, and oxygen                         saturations were monitored  continuously. The                         Colonoscope was introduced through the anus and                         advanced to the the terminal ileum, with                         identification of the appendiceal orifice and IC                         valve. The colonoscopy was performed  without                         difficulty. The patient tolerated the procedure well.                         The quality of the bowel preparation was good. The                         ileocecal valve, appendiceal orifice, and rectum were                         photographed. Findings:      The perianal and digital rectal examinations were normal. Pertinent       negatives include normal sphincter tone and no palpable rectal lesions.      A few small-mouthed diverticula were found in the sigmoid colon.       Estimated blood loss: none.      Non-bleeding internal hemorrhoids were found during retroflexion. The       hemorrhoids were Grade I (internal hemorrhoids that do not prolapse).      The exam was otherwise without abnormality.      The terminal ileum appeared normal. Impression:            - Diverticulosis in the sigmoid colon.                        - Non-bleeding internal hemorrhoids.                        - The examination was otherwise normal.                        - No specimens collected. Recommendation:        - Patient has a contact number available for                         emergencies. The signs and symptoms of potential                         delayed complications were discussed with the patient.                         Return to normal activities tomorrow. Written                         discharge instructions were provided to the patient.                        -  Await pathology results from EGD, also performed                         today.                        - Resume previous diet.                        - Continue present medications.                        - Repeat colonoscopy in 10 years for screening                         purposes.                        - Return to GI office in 1 year.                        - The findings and recommendations were discussed with                         the patient. Procedure Code(s):     --- Professional ---                         [id number], Colorectal cancer screening; colonoscopy on                         individual not meeting criteria for high risk Diagnosis Code(s):     --- Professional ---                        K57.30, Diverticulosis of large intestine without                         perforation or abscess without bleeding                        K64.0, First degree hemorrhoids                        Z12.11, Encounter for screening for malignant neoplasm                         of colon CPT copyright 2019 American Medical Association. All rights reserved. The codes documented in this report are preliminary and upon coder review may  be revised to meet current compliance requirements. Efrain Sella MD, MD 05/13/2019 2:06:56 PM This report has been signed electronically. Number of Addenda: 0 Note Initiated On: 05/13/2019 1:29 PM Scope Withdrawal Time: 0 hours 9 minutes 9 seconds  Total Procedure Duration: 0 hours 10 minutes 57 seconds  Estimated Blood Loss:  Estimated blood loss: none. Estimated blood loss: none.      [facility]

## 2019-05-13 NOTE — Transfer of Care (Signed)
Immediate Anesthesia Transfer of Care Note  Patient: Andrew Mendoza  Procedure(s) Performed: ESOPHAGOGASTRODUODENOSCOPY (EGD) WITH PROPOFOL (N/A ) COLONOSCOPY WITH PROPOFOL (N/A )  Patient Location: PACU  Anesthesia Type:General  Level of Consciousness: sedated  Airway & Oxygen Therapy: Patient Spontanous Breathing  Post-op Assessment: Report given to RN and Post -op Vital signs reviewed and stable  Post vital signs: Reviewed and stable  Last Vitals:  Vitals Value Taken Time  BP 94/57 05/13/19 1407  Temp 35.9 C 05/13/19 1407  Pulse 49 05/13/19 1408  Resp 14 05/13/19 1408  SpO2 97 % 05/13/19 1408  Vitals shown include unvalidated device data.  Last Pain:  Vitals:   05/13/19 1407  TempSrc: Temporal  PainSc: Asleep         Complications: No apparent anesthesia complications

## 2019-05-13 NOTE — Anesthesia Preprocedure Evaluation (Signed)
Anesthesia Evaluation  Patient identified by MRN, date of birth, ID band Patient awake    Reviewed: Allergy & Precautions, H&P , NPO status , Patient's Chart, lab work & pertinent test results, reviewed documented beta blocker date and time   History of Anesthesia Complications Negative for: history of anesthetic complications  Airway Mallampati: III  TM Distance: >3 FB Neck ROM: full    Dental  (+) Caps, Dental Advidsory Given, Teeth Intact   Pulmonary neg shortness of breath, neg COPD, neg recent URI, former smoker,    Pulmonary exam normal        Cardiovascular Exercise Tolerance: Good negative cardio ROS Normal cardiovascular exam     Neuro/Psych negative neurological ROS  negative psych ROS   GI/Hepatic Neg liver ROS, GERD  ,  Endo/Other  negative endocrine ROS  Renal/GU negative Renal ROS  negative genitourinary   Musculoskeletal   Abdominal   Peds  Hematology negative hematology ROS (+)   Anesthesia Other Findings Past Medical History: No date: GERD (gastroesophageal reflux disease) No date: History of chicken pox No date: Hypercholesteremia No date: Throat cancer ([facility])   Reproductive/Obstetrics negative OB ROS                             Anesthesia Physical Anesthesia Plan  ASA: II  Anesthesia Plan: General   Post-op Pain Management:    Induction: Intravenous  PONV Risk Score and Plan: 2 and Propofol infusion and TIVA  Airway Management Planned: Natural Airway and Nasal Cannula  Additional Equipment:   Intra-op Plan:   Post-operative Plan:   Informed Consent: I have reviewed the patients History and Physical, chart, labs and discussed the procedure including the risks, benefits and alternatives for the proposed anesthesia with the patient or authorized representative who has indicated his/her understanding and acceptance.     Dental Advisory Given  Plan  Discussed with: Anesthesiologist, CRNA and Surgeon  Anesthesia Plan Comments:         Anesthesia Quick Evaluation

## 2019-05-13 NOTE — H&P (Signed)
Outpatient short stay form Pre-procedure 05/13/2019 12:55 PM  Andrew Mendoza, M.D.  Primary Physician: GERD, colon cancer screening  Reason for visit:  GERD, colon cancer screening  History of present illness:  Patient has well controlled GERD without dysphagia or abdominal pain. Patient denies change in bowel habits, rectal bleeding, weight loss or abdominal pain.     No current facility-administered medications for this encounter.  Medications Prior to Admission  Medication Sig Dispense Refill Last Dose  . omeprazole (PRILOSEC) 40 MG capsule Take 40 mg by mouth daily.    05/12/2019 at Unknown time  . SIMVASTATIN PO Take by mouth.   05/13/2019 at Unknown time  . aspirin 81 MG tablet Take 81 mg by mouth daily.   05/10/2019  . Multiple Vitamin (MULTIVITAMIN) tablet Take 1 tablet by mouth daily.   05/10/2019  . Omega-3 Fatty Acids (FISH OIL) 600 MG CAPS Take by mouth daily.   05/10/2019  . vitamin E (VITAMIN E) 400 UNIT capsule Take 800 Units by mouth daily.   05/10/2019     No Known Allergies   Past Medical History:  Diagnosis Date  . GERD (gastroesophageal reflux disease)   . History of chicken pox   . Hypercholesteremia   . Throat cancer [facility])     Review of systems:  Otherwise negative.    Physical Exam  Gen: Alert, oriented. Appears stated age.  HEENT: [place]/AT. PERRLA. Lungs: CTA, no wheezes. CV: RR nl S1, S2. Abd: soft, benign, no masses. BS+ Ext: No edema. Pulses 2+    Planned procedures: Proceed with EGD and  colonoscopy. The patient understands the nature of the planned procedure, indications, risks, alternatives and potential complications including but not limited to bleeding, infection, perforation, damage to internal organs and possible oversedation/side effects from anesthesia. The patient agrees and gives consent to proceed.  Please refer to procedure notes for findings, recommendations and patient disposition/instructions.      Andrew Mendoza,  M.D. Gastroenterology 05/13/2019  12:55 PM

## 2019-05-13 NOTE — Op Note (Signed)
[facility] Gastroenterology Patient Name: Andrew Mendoza Procedure Date: 05/13/2019 1:32 PM MRN: [id number] Account #: [id number] Date of Birth: [birth year] Admit Type: Outpatient Age: 63 Room: [facility] ENDO ROOM 1 Gender: Male Note Status: Finalized Procedure:             Upper GI endoscopy Indications:           Esophageal reflux Providers:             Benay Pike. [place] MD, MD Medicines:             Propofol per Anesthesia Complications:         No immediate complications. Procedure:             Pre-Anesthesia Assessment:                        - The risks and benefits of the procedure and the                         sedation options and risks were discussed with the                         patient. All questions were answered and informed                         consent was obtained.                        - Patient identification and proposed procedure were                         verified prior to the procedure by the nurse. The                         procedure was verified in the procedure room.                        - ASA Grade Assessment: II - A patient with mild                         systemic disease.                        - After reviewing the risks and benefits, the patient                         was deemed in satisfactory condition to undergo the                         procedure.                        After obtaining informed consent, the endoscope was                         passed under direct vision. Throughout the procedure,                         the patient's blood pressure, pulse, and oxygen  saturations were monitored continuously. The Endoscope                         was introduced through the mouth, and advanced to the                         third part of duodenum. The upper GI endoscopy was                         accomplished without difficulty. The patient tolerated                         the procedure  well. Findings:      The Z-line was irregular and was found at the gastroesophageal junction.       Mucosa was biopsied with a cold forceps for histology. One specimen       bottle was sent to pathology.      The entire examined stomach was normal.      The examined duodenum was normal. Impression:            - Z-line irregular, at the gastroesophageal junction.                         Biopsied.                        - Normal stomach.                        - Normal examined duodenum. Recommendation:        - Await pathology results.                        - Proceed with colonoscopy Procedure Code(s):     --- Professional ---                        [phone], Esophagogastroduodenoscopy, flexible,                         transoral; with biopsy, single or multiple Diagnosis Code(s):     --- Professional ---                        K21.9, Gastro-esophageal reflux disease without                         esophagitis                        K22.8, Other specified diseases of esophagus CPT copyright 2019 American Medical Association. All rights reserved. The codes documented in this report are preliminary and upon coder review may  be revised to meet current compliance requirements. Efrain Sella MD, MD 05/13/2019 1:46:46 PM This report has been signed electronically. Number of Addenda: 0 Note Initiated On: 05/13/2019 1:32 PM Estimated Blood Loss:  Estimated blood loss: none.      [facility]

## 2019-05-13 NOTE — H&P (Signed)
Outpatient short stay form Pre-procedure 05/13/2019 1:29 PM  K. Alice Reichert, M.D.  Primary Physician: [place] Pink, M.D.  Reason for visit:  GERD, Colon cancer screening  History of present illness: Pleasant 63 y/o male presents with GERD that is controlled with daily Omeprazole. No dysphagia, abdominal pain, nausea, vomiting, weight loss.  Patient presents for colonoscopy for colon cancer screening. The patient denies complaints of abdominal pain, significant change in bowel habits, or rectal bleeding.      Current Facility-Administered Medications:  .  0.9 %  sodium chloride infusion, , Intravenous, Continuous, [place], Benay Pike, MD, Last Rate: 20 mL/hr at 05/13/19 1305, New Bag at 05/13/19 1305  Medications Prior to Admission  Medication Sig Dispense Refill Last Dose  . omeprazole (PRILOSEC) 40 MG capsule Take 40 mg by mouth daily.    05/12/2019 at Unknown time  . SIMVASTATIN PO Take by mouth.   05/13/2019 at Unknown time  . aspirin 81 MG tablet Take 81 mg by mouth daily.   05/10/2019  . Multiple Vitamin (MULTIVITAMIN) tablet Take 1 tablet by mouth daily.   05/10/2019  . Omega-3 Fatty Acids (FISH OIL) 600 MG CAPS Take by mouth daily.   05/10/2019  . vitamin E (VITAMIN E) 400 UNIT capsule Take 800 Units by mouth daily.   05/10/2019     No Known Allergies   Past Medical History:  Diagnosis Date  . GERD (gastroesophageal reflux disease)   . History of chicken pox   . Hypercholesteremia   . Throat cancer [facility])     Review of systems:  Otherwise negative.    Physical Exam  Gen: Alert, oriented. Appears stated age.  HEENT: [place]/AT. PERRLA. Lungs: CTA, no wheezes. CV: RR nl S1, S2. Abd: soft, benign, no masses. BS+ Ext: No edema. Pulses 2+    Planned procedures: Proceed with EGD and colonoscopy. The patient understands the nature of the planned procedure, indications, risks, alternatives and potential complications including but not limited to bleeding, infection,  perforation, damage to internal organs and possible oversedation/side effects from anesthesia. The patient agrees and gives consent to proceed.  Please refer to procedure notes for findings, recommendations and patient disposition/instructions.      K. Alice Reichert, M.D. Gastroenterology 05/13/2019  1:29 PM

## 2019-05-13 NOTE — Anesthesia Post-op Follow-up Note (Signed)
Anesthesia QCDR form completed.        

## 2019-05-13 NOTE — Interval H&P Note (Signed)
History and Physical Interval Note:  05/13/2019 1:30 PM  Andrew Mendoza  has presented today for surgery, with the diagnosis of [employer].  The various methods of treatment have been discussed with the patient and family. After consideration of risks, benefits and other options for treatment, the patient has consented to  Procedure(s): ESOPHAGOGASTRODUODENOSCOPY (EGD) WITH PROPOFOL (N/A) COLONOSCOPY WITH PROPOFOL (N/A) as a surgical intervention.  The patient's history has been reviewed, patient examined, no change in status, stable for surgery.  I have reviewed the patient's chart and labs.  Questions were answered to the patient's satisfaction.     [place], [place]

## 2019-05-14 ENCOUNTER — Encounter: Payer: Self-pay | Admitting: *Deleted

## 2019-05-14 NOTE — Anesthesia Postprocedure Evaluation (Signed)
Anesthesia Post Note  Patient: Andrew Mendoza  Procedure(s) Performed: ESOPHAGOGASTRODUODENOSCOPY (EGD) WITH PROPOFOL (N/A ) COLONOSCOPY WITH PROPOFOL (N/A )  Patient location during evaluation: Endoscopy Anesthesia Type: General Level of consciousness: awake and alert Pain management: pain level controlled Vital Signs Assessment: post-procedure vital signs reviewed and stable Respiratory status: spontaneous breathing, nonlabored ventilation, respiratory function stable and patient connected to nasal cannula oxygen Cardiovascular status: blood pressure returned to baseline and stable Postop Assessment: no apparent nausea or vomiting Anesthetic complications: no     Last Vitals:  Vitals:   05/13/19 1417 05/13/19 1427  BP: 106/62 125/83  Pulse: (!) 43 (!) 43  Resp: 13 14  Temp:    SpO2: 99% 99%    Last Pain:  Vitals:   05/13/19 1427  TempSrc:   PainSc: 0-No pain                 Martha Clan

## 2019-05-15 LAB — SURGICAL PATHOLOGY

## 2019-12-02 ENCOUNTER — Emergency Department (HOSPITAL_COMMUNITY): Payer: No Typology Code available for payment source

## 2019-12-02 ENCOUNTER — Observation Stay (HOSPITAL_COMMUNITY)
Admission: EM | Admit: 2019-12-02 | Discharge: 2019-12-04 | DRG: 066 | Disposition: A | Discharge status: Home / Self Care | Payer: No Typology Code available for payment source | Attending: Internal Medicine | Admitting: Internal Medicine

## 2019-12-02 ENCOUNTER — Other Ambulatory Visit: Payer: Self-pay

## 2019-12-02 ENCOUNTER — Encounter (HOSPITAL_COMMUNITY): Payer: Self-pay | Admitting: *Deleted

## 2019-12-02 DIAGNOSIS — K219 Gastro-esophageal reflux disease without esophagitis: Secondary | ICD-10-CM | POA: Diagnosis present

## 2019-12-02 DIAGNOSIS — Z87891 Personal history of nicotine dependence: Secondary | ICD-10-CM | POA: Diagnosis not present

## 2019-12-02 DIAGNOSIS — R29810 Facial weakness: Secondary | ICD-10-CM | POA: Diagnosis present

## 2019-12-02 DIAGNOSIS — E7800 Pure hypercholesterolemia, unspecified: Secondary | ICD-10-CM | POA: Diagnosis present

## 2019-12-02 DIAGNOSIS — E785 Hyperlipidemia, unspecified: Secondary | ICD-10-CM | POA: Diagnosis present

## 2019-12-02 DIAGNOSIS — R29703 NIHSS score 3: Secondary | ICD-10-CM | POA: Diagnosis not present

## 2019-12-02 DIAGNOSIS — I6381 Other cerebral infarction due to occlusion or stenosis of small artery: Secondary | ICD-10-CM | POA: Diagnosis not present

## 2019-12-02 DIAGNOSIS — Z803 Family history of malignant neoplasm of breast: Secondary | ICD-10-CM

## 2019-12-02 DIAGNOSIS — Z20822 Contact with and (suspected) exposure to covid-19: Secondary | ICD-10-CM | POA: Diagnosis not present

## 2019-12-02 DIAGNOSIS — R001 Bradycardia, unspecified: Secondary | ICD-10-CM | POA: Diagnosis not present

## 2019-12-02 DIAGNOSIS — G459 Transient cerebral ischemic attack, unspecified: Secondary | ICD-10-CM | POA: Diagnosis present

## 2019-12-02 DIAGNOSIS — I10 Essential (primary) hypertension: Secondary | ICD-10-CM | POA: Diagnosis not present

## 2019-12-02 DIAGNOSIS — Z8249 Family history of ischemic heart disease and other diseases of the circulatory system: Secondary | ICD-10-CM

## 2019-12-02 DIAGNOSIS — R531 Weakness: Secondary | ICD-10-CM | POA: Diagnosis not present

## 2019-12-02 DIAGNOSIS — Z823 Family history of stroke: Secondary | ICD-10-CM | POA: Diagnosis not present

## 2019-12-02 DIAGNOSIS — I6521 Occlusion and stenosis of right carotid artery: Secondary | ICD-10-CM | POA: Diagnosis present

## 2019-12-02 DIAGNOSIS — Z85819 Personal history of malignant neoplasm of unspecified site of lip, oral cavity, and pharynx: Secondary | ICD-10-CM | POA: Diagnosis not present

## 2019-12-02 DIAGNOSIS — Z79899 Other long term (current) drug therapy: Secondary | ICD-10-CM | POA: Diagnosis not present

## 2019-12-02 DIAGNOSIS — I639 Cerebral infarction, unspecified: Secondary | ICD-10-CM | POA: Diagnosis present

## 2019-12-02 DIAGNOSIS — Z7982 Long term (current) use of aspirin: Secondary | ICD-10-CM

## 2019-12-02 LAB — I-STAT CHEM 8, ED
BUN: 18 mg/dL (ref 8–23)
Calcium, Ion: 1.22 mmol/L (ref 1.15–1.40)
Chloride: 103 mmol/L (ref 98–111)
Creatinine, Ser: 1.1 mg/dL (ref 0.61–1.24)
Glucose, Bld: 152 mg/dL — ABNORMAL HIGH (ref 70–99)
HCT: 42 % (ref 39.0–52.0)
Hemoglobin: 14.3 g/dL (ref 13.0–17.0)
Potassium: 3.8 mmol/L (ref 3.5–5.1)
Sodium: 138 mmol/L (ref 135–145)
TCO2: 25 mmol/L (ref 22–32)

## 2019-12-02 LAB — CBC
HCT: 45.1 % (ref 39.0–52.0)
Hemoglobin: 15.1 g/dL (ref 13.0–17.0)
MCH: 30.6 pg (ref 26.0–34.0)
MCHC: 33.5 g/dL (ref 30.0–36.0)
MCV: 91.5 fL (ref 80.0–100.0)
Platelets: 251 K/uL (ref 150–400)
RBC: 4.93 MIL/uL (ref 4.22–5.81)
RDW: 12.2 % (ref 11.5–15.5)
WBC: 6.6 K/uL (ref 4.0–10.5)
nRBC: 0 % (ref 0.0–0.2)

## 2019-12-02 LAB — COMPREHENSIVE METABOLIC PANEL
ALT: 28 U/L (ref 0–44)
AST: 23 U/L (ref 15–41)
Albumin: 4.3 g/dL (ref 3.5–5.0)
Alkaline Phosphatase: 70 U/L (ref 38–126)
Anion gap: 12 (ref 5–15)
BUN: 16 mg/dL (ref 8–23)
CO2: 21 mmol/L — ABNORMAL LOW (ref 22–32)
Calcium: 9.1 mg/dL (ref 8.9–10.3)
Chloride: 104 mmol/L (ref 98–111)
Creatinine, Ser: 1.07 mg/dL (ref 0.61–1.24)
GFR calc Af Amer: >60 mL/min (ref >60)
GFR calc non Af Amer: >60 mL/min (ref >60)
Glucose, Bld: 141 mg/dL — ABNORMAL HIGH (ref 70–99)
Potassium: 3.8 mmol/L (ref 3.5–5.1)
Sodium: 137 mmol/L (ref 135–145)
Total Bilirubin: 1 mg/dL (ref 0.3–1.2)
Total Protein: 7.1 g/dL (ref 6.5–8.1)

## 2019-12-02 LAB — DIFFERENTIAL
Abs Immature Granulocytes: 0.03 10*3/uL (ref 0.00–0.07)
Basophils Absolute: 0 10*3/uL (ref 0.0–0.1)
Basophils Relative: 0 %
Eosinophils Absolute: 0.1 10*3/uL (ref 0.0–0.5)
Eosinophils Relative: 2 %
Immature Granulocytes: 1 %
Lymphocytes Relative: 17 %
Lymphs Abs: 1.1 10*3/uL (ref 0.7–4.0)
Monocytes Absolute: 0.5 10*3/uL (ref 0.1–1.0)
Monocytes Relative: 8 %
Neutro Abs: 4.9 10*3/uL (ref 1.7–7.7)
Neutrophils Relative %: 72 %

## 2019-12-02 LAB — PROTIME-INR
INR: 1 (ref 0.8–1.2)
Prothrombin Time: 13.1 seconds (ref 11.4–15.2)

## 2019-12-02 LAB — APTT: aPTT: 34 seconds (ref 24–36)

## 2019-12-02 IMAGING — CT CT HEAD W/O CM
3 series · 16 of 47 positions shown, 19 images · non-contrast
Comparison: None.

CLINICAL DATA: Left-sided facial droop and drooling.

EXAM:
CT HEAD WITHOUT CONTRAST
TECHNIQUE: Contiguous axial images were obtained from the base of the skull
through the vertex without intravenous contrast.

[Series 3: head 5.0 h30s · axial · 0.44mm/px · z∈[-76,+54]mm · 10 of 32 slices shown, 13 images]
[im 3/32  brain]
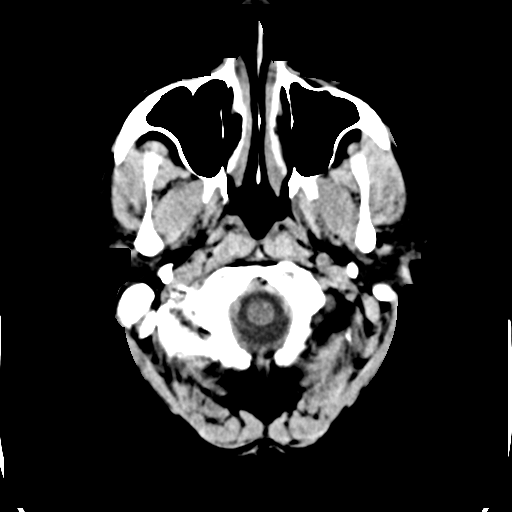
[im 3/32  bone]
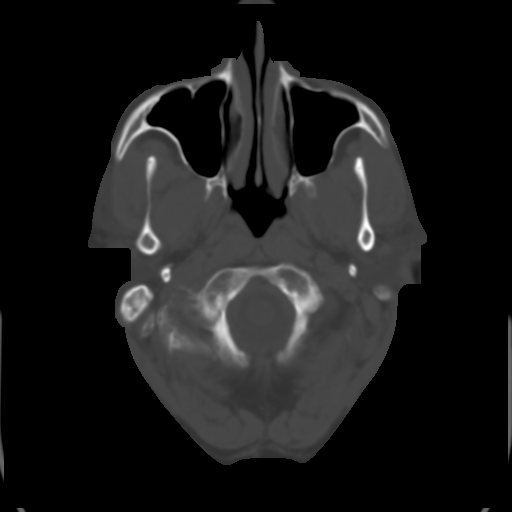
[im 6/32  brain]
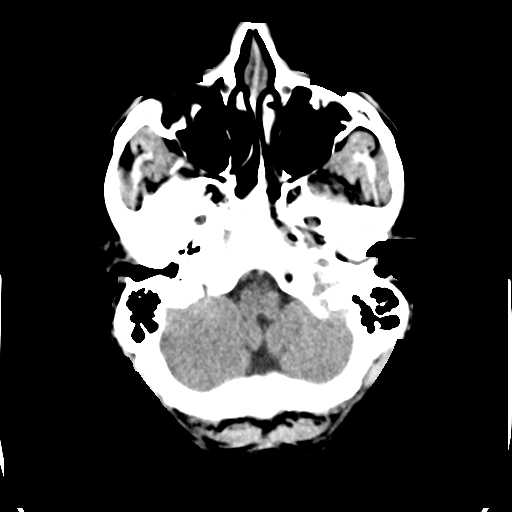
[im 9/32  brain]
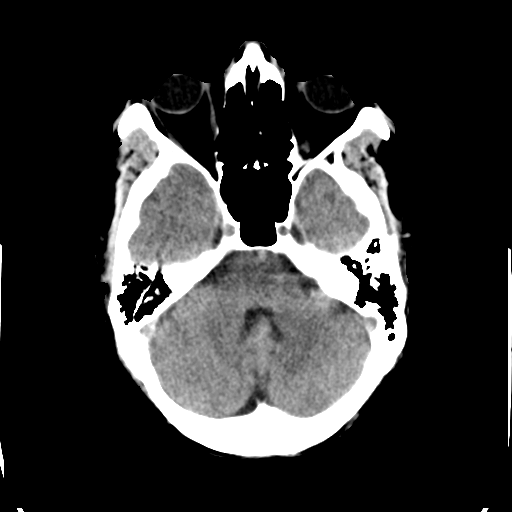
[im 11/32  brain]
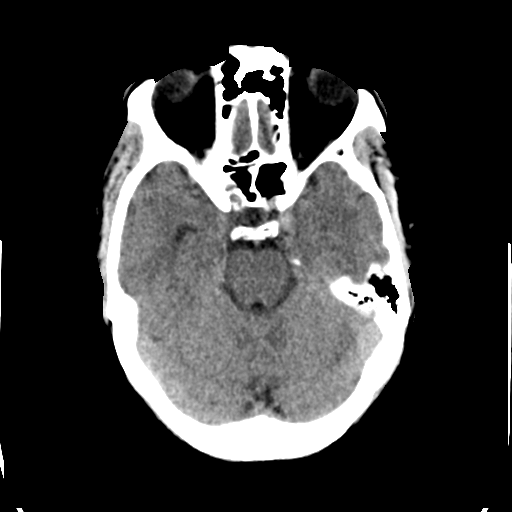
[im 14/32  brain]
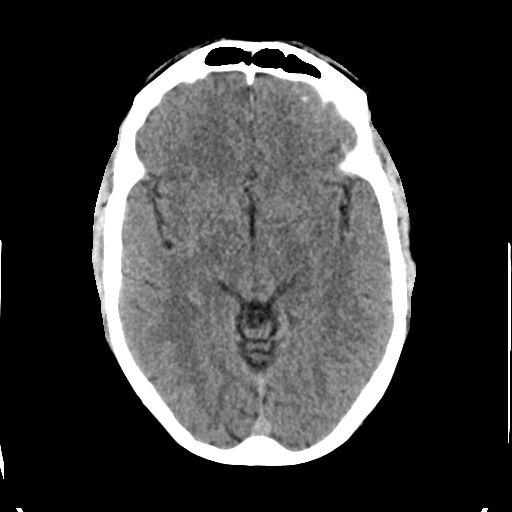
[im 14/32  bone]
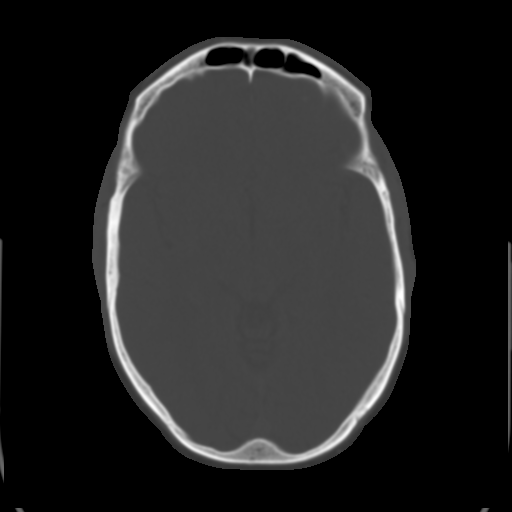
[im 18/32  brain]
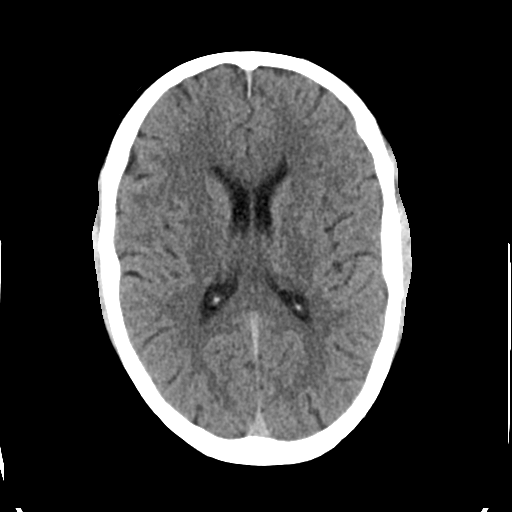
[im 21/32  brain]
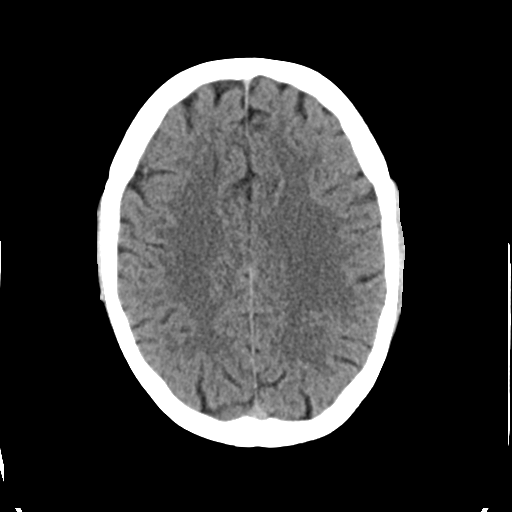
[im 24/32  brain]
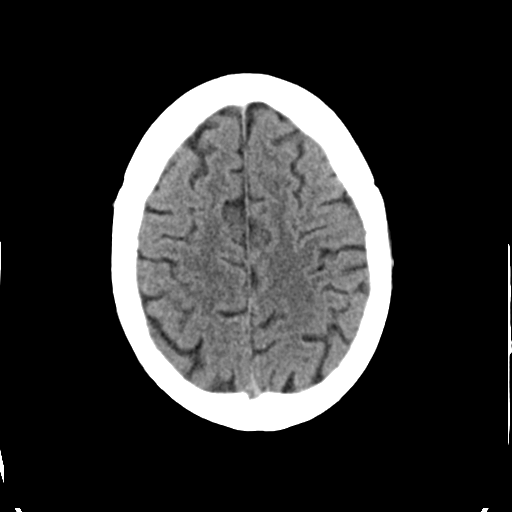
[im 26/32  brain]
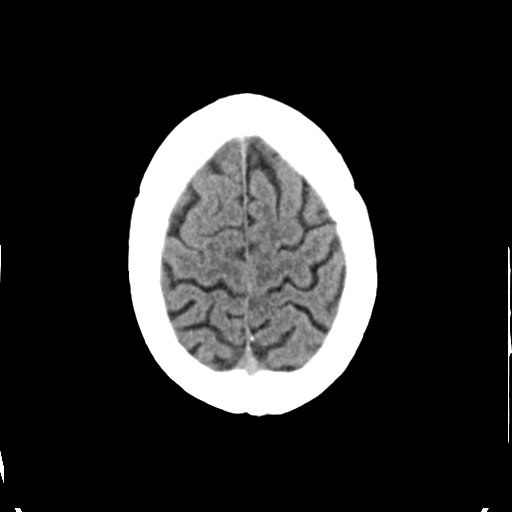
[im 26/32  bone]
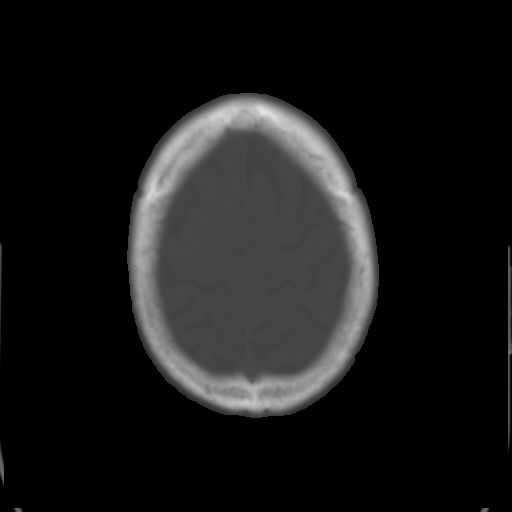
[im 29/32  brain]
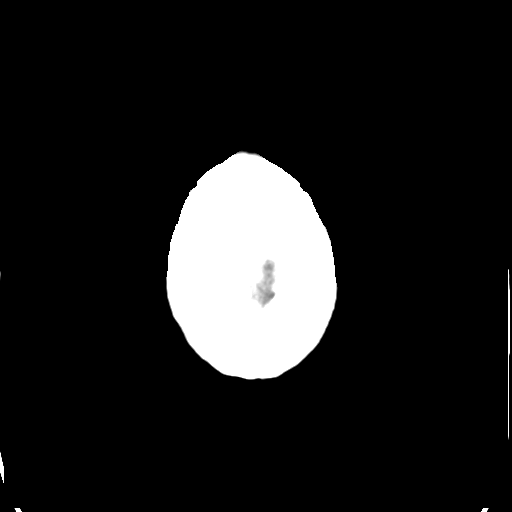

[Series 5: head 3.0 mpr cor · coronal · 0.32mm/px · 3 of 74 slices shown]
[im 25/74  brain]
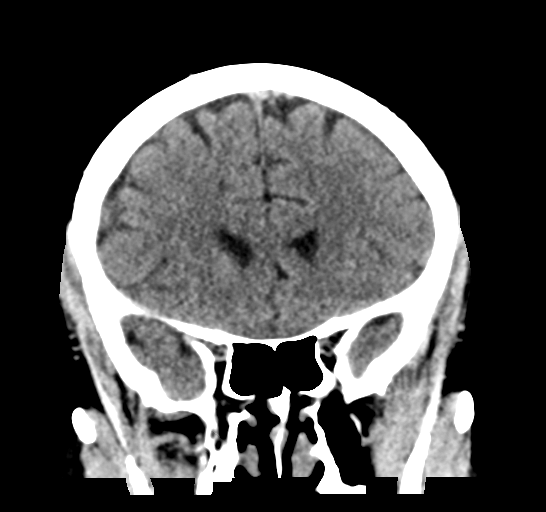
[im 33/74  brain]
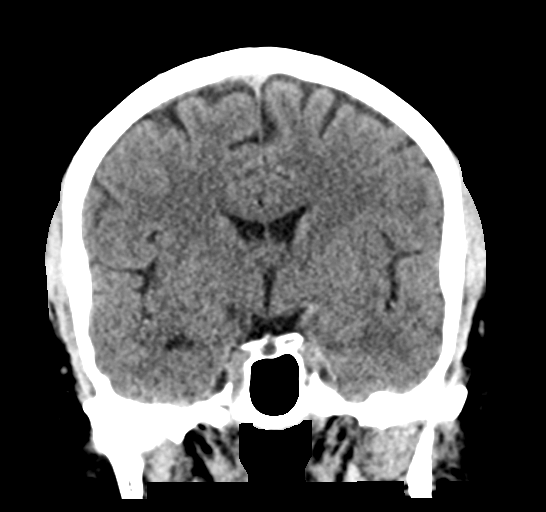
[im 41/74  brain]
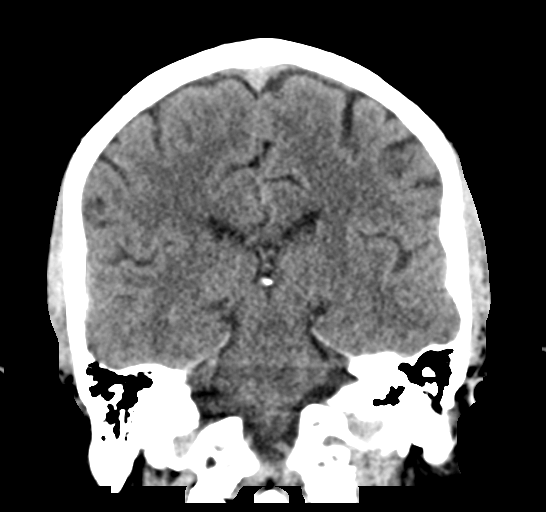

[Series 6: head 3.0 mpr sag · sagittal · 0.32mm/px · 3 of 61 slices shown]
[im 21/61  brain]
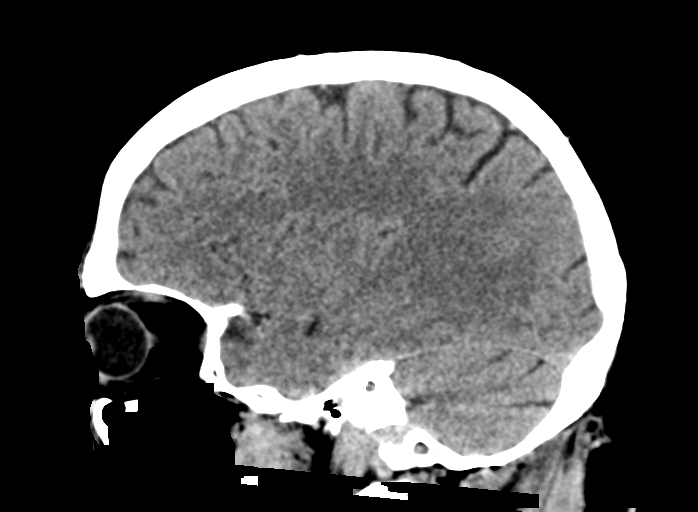
[im 31/61  brain]
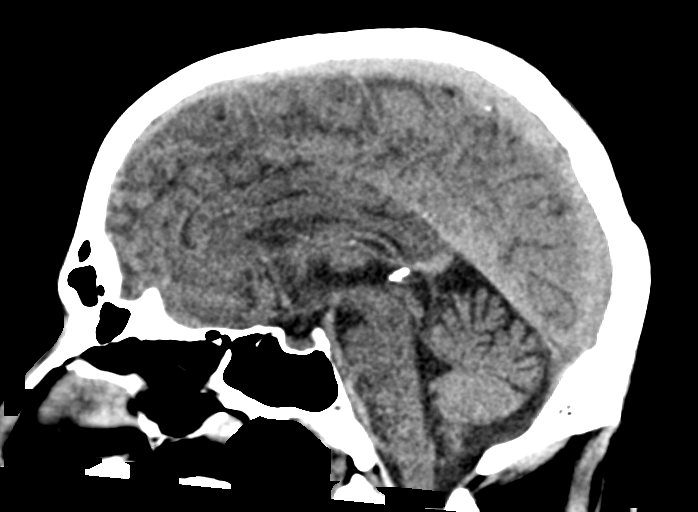
[im 41/61  brain]
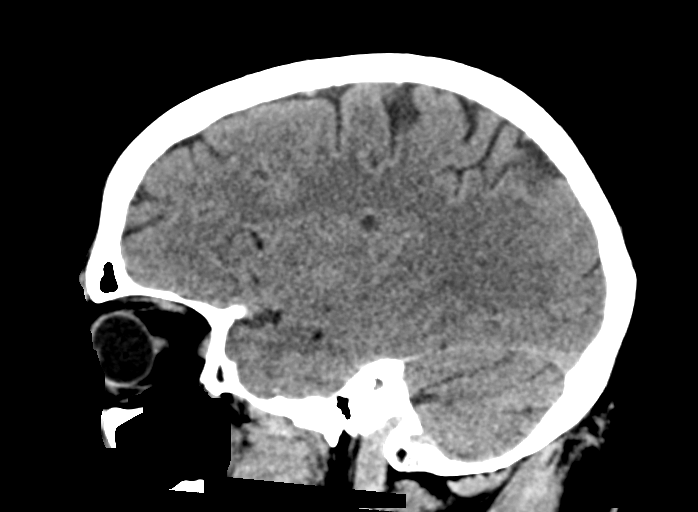

[16 of 47 positions shown; findings below may reference images not displayed]

FINDINGS: Brain: No evidence of acute infarction, hemorrhage, hydrocephalus,
extra-axial collection or mass lesion/mass effect.

Vascular: No hyperdense vessel or unexpected calcification.

Skull: Normal. Negative for fracture or focal lesion.

Sinuses/Orbits: No acute finding.

Other: None.
IMPRESSION: No acute intracranial process.

## 2019-12-02 IMAGING — MR MR HEAD W/O CM
10 of 11 series · 43 of 48 positions shown · non-contrast
Comparison: Prior head CT from earlier the same day.

CLINICAL DATA: Initial evaluation for acute TIA, left facial droop.

EXAM:
MRI HEAD WITHOUT CONTRAST
TECHNIQUE: Multiplanar, multiecho pulse sequences of the brain and surrounding
structures were obtained without intravenous contrast.

[Series 5: DWI · axial · 3.0mm · 0.88mm/px · z∈[-38,+103]mm · 10 of 100 slices shown (1 of 4)]
[im 1/100]
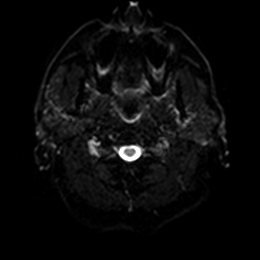
[im 12/100]
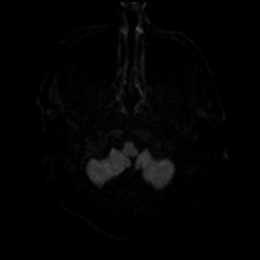
[im 23/100]
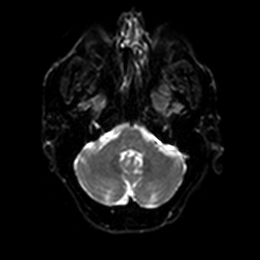
[im 34/100]
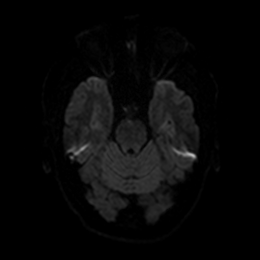
[im 45/100]
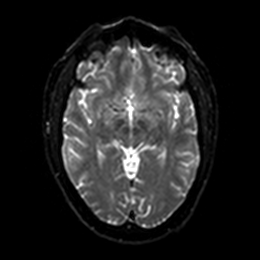
[im 56/100]
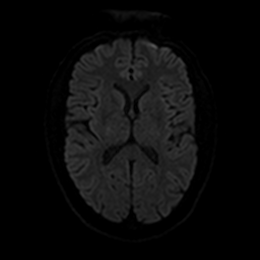
[im 67/100]
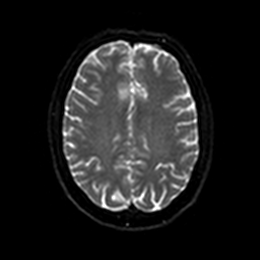
[im 78/100]
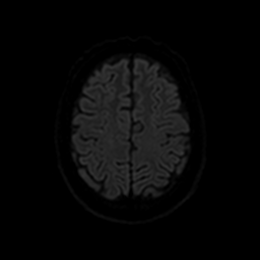
[im 89/100]
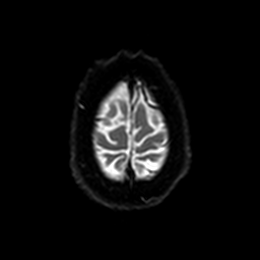
[im 100/100]
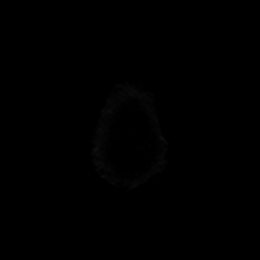

[Series 6: DWI · axial · 3.0mm · 0.88mm/px · z∈[-38,+103]mm · 5 of 50 slices shown (2 of 4)]
[im 1/50]
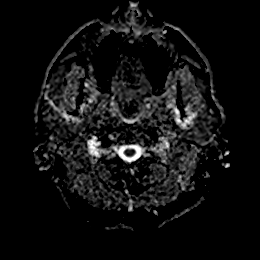
[im 13/50]
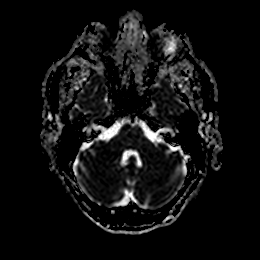
[im 25/50]
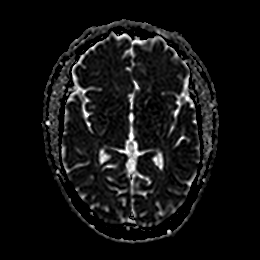
[im 37/50]
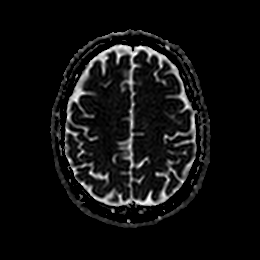
[im 50/50]
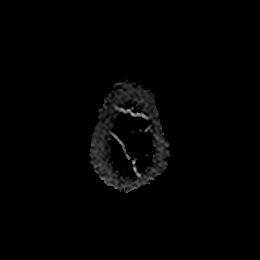

[Series 7: DWI · coronal · 4.0mm · 0.88mm/px · 6 of 66 slices shown (3 of 4)]
[im 1/66]
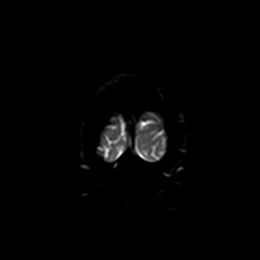
[im 14/66]
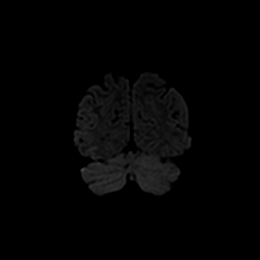
[im 27/66]
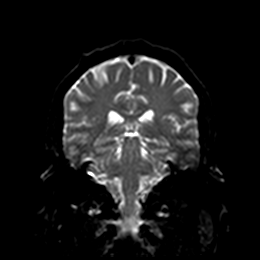
[im 40/66]
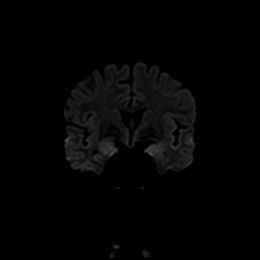
[im 53/66]
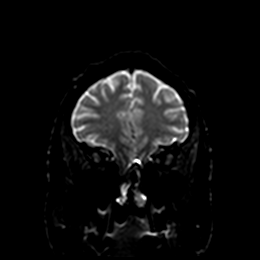
[im 66/66]
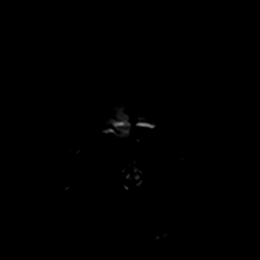

[Series 8: DWI · coronal · 4.0mm · 0.88mm/px · 3 of 33 slices shown (4 of 4)]
[im 1/33]
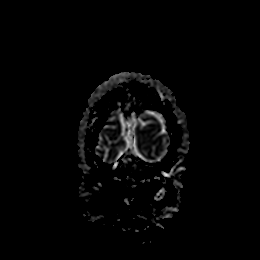
[im 17/33]
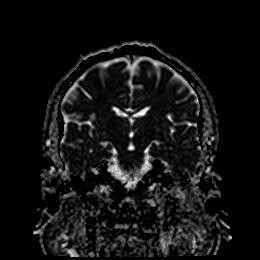
[im 33/33]
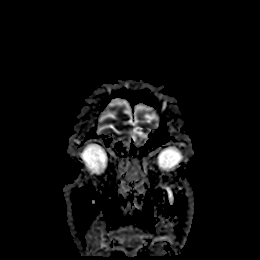

[Series 9: T1 · sagittal · 5.0mm · 0.75mm/px · 2 of 23 slices shown]
[im 1/23]
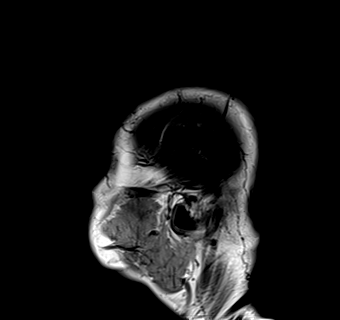
[im 23/23]
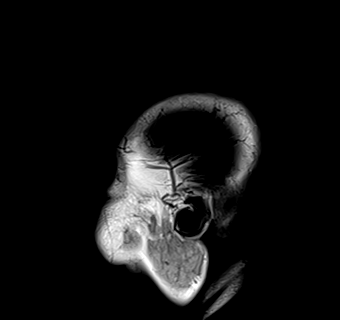

[Series 10: T2 · axial · 5.0mm · 0.72mm/px · z∈[-36,+103]mm · 2 of 25 slices shown (1 of 2)]
[im 1/25]
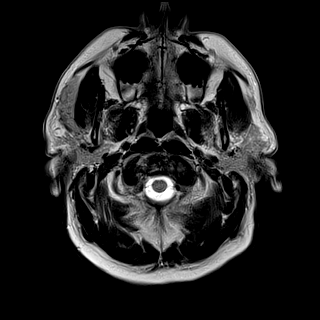
[im 25/25]
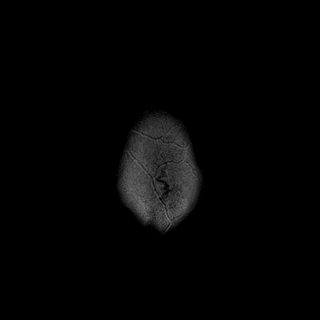

[Series 11: FLAIR · axial · 5.0mm · 0.45mm/px · z∈[-35,+103]mm · 2 of 25 slices shown]
[im 1/25]
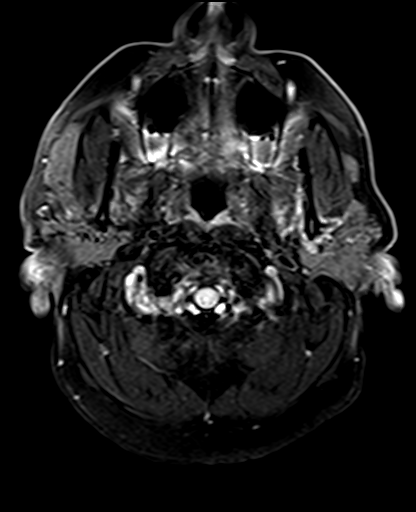
[im 25/25]
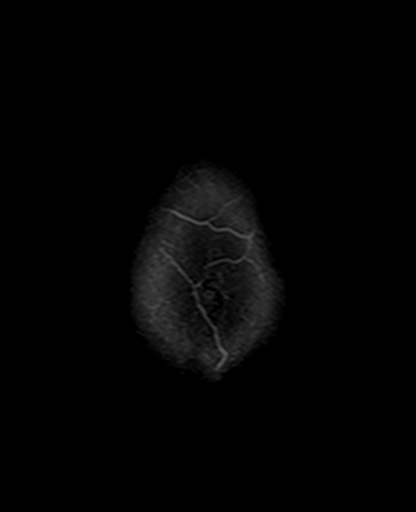

[Series 13: pha_images · axial · 3.0mm · 0.90mm/px · z∈[-51,+119]mm · 5 of 60 slices shown]
[im 1/60]
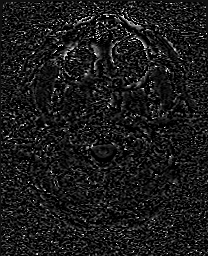
[im 15/60]
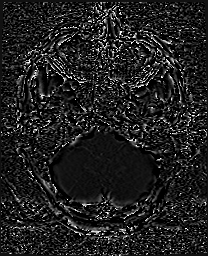
[im 30/60]
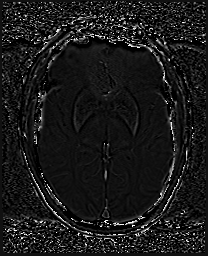
[im 45/60]
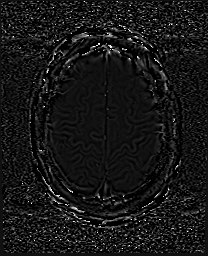
[im 60/60]
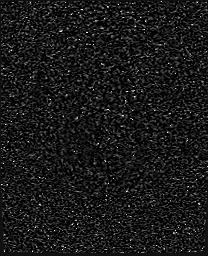

[Series 14: swi_images · axial · 3.0mm · 0.90mm/px · z∈[-51,+119]mm · 5 of 60 slices shown]
[im 1/60]
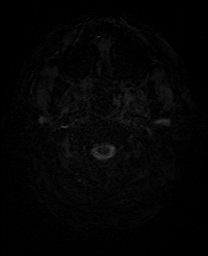
[im 15/60]
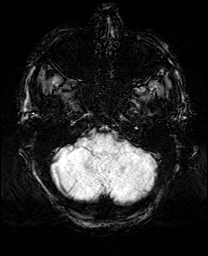
[im 30/60]
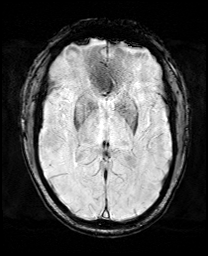
[im 45/60]
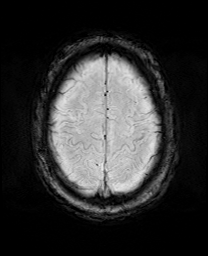
[im 60/60]
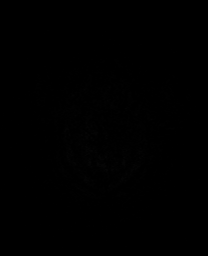

[Series 17: T2 · coronal · 5.0mm · 0.34mm/px · 3 of 29 slices shown (2 of 2)]
[im 1/29]
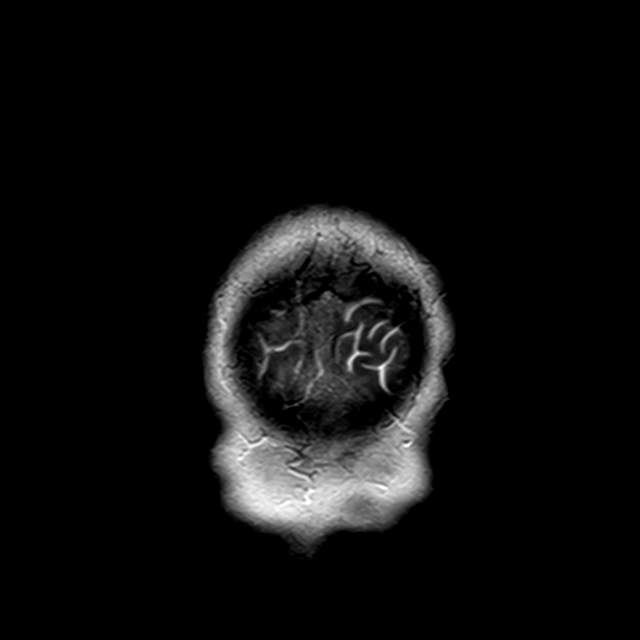
[im 15/29]
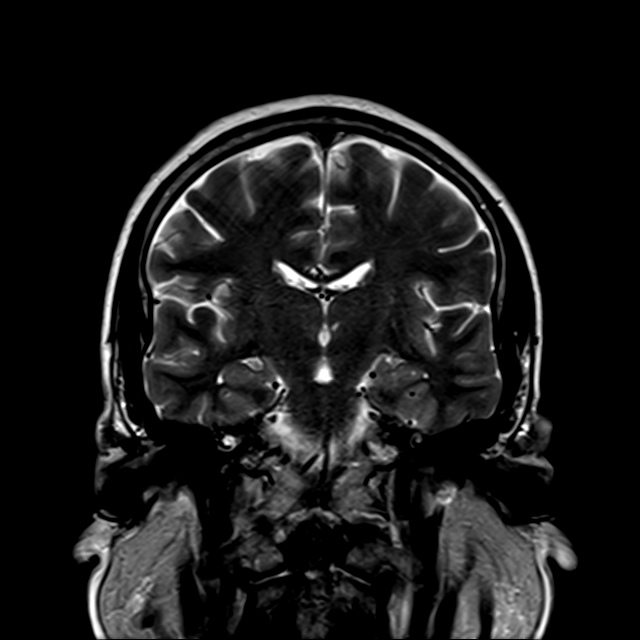
[im 29/29]
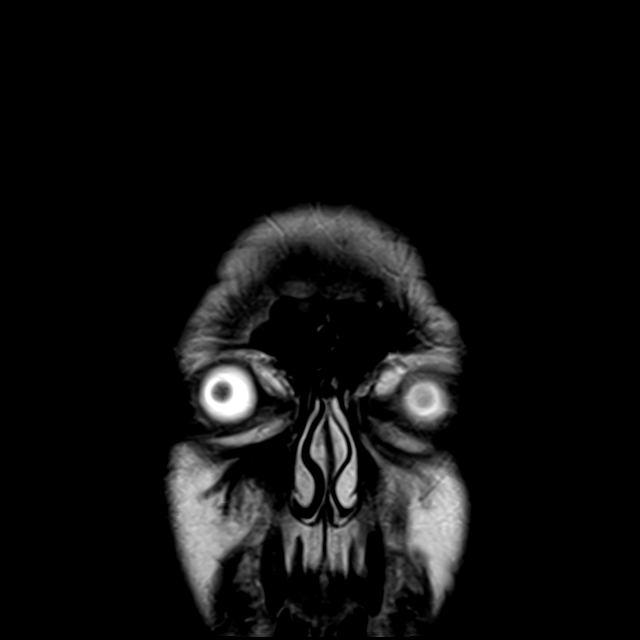

[43 of 48 positions shown; findings below may reference images not displayed]

FINDINGS: Brain: Cerebral volume within normal limits for patient age. No
significant cerebral white matter disease for age. Tiny remote
lacunar infarct noted within the left thalamus. Additional small
remote lacunar infarct noted at the left lentiform nucleus.

No abnormal foci of restricted diffusion to suggest acute or
subacute ischemia. Gray-white matter differentiation well
maintained. No encephalomalacia to suggest chronic infarction. No
foci of susceptibility artifact to suggest acute or chronic
intracranial hemorrhage.

No mass lesion, midline shift or mass effect. No hydrocephalus. No
extra-axial fluid collection. Major dural sinuses are grossly
patent.

Pituitary gland and suprasellar region are normal. Midline
structures intact and normal.

Vascular: Major intracranial vascular flow voids well maintained and
normal in appearance.

Skull and upper cervical spine: Craniocervical junction normal.
Visualized upper cervical spine within normal limits. Bone marrow
signal intensity normal. No scalp soft tissue abnormality.

Sinuses/Orbits: Globes and orbital soft tissues within normal
limits.

Paranasal sinuses are clear. No mastoid effusion. Inner ear
structures normal.

Other: None.
IMPRESSION: 1. No acute intracranial abnormality.
2. Small remote lacunar infarcts at the left lentiform nucleus and
left thalamus.
3. Otherwise normal brain MRI for age.

## 2019-12-02 IMAGING — CT CT ANGIO NECK
2 of 7 series · 8 of 33 positions shown · IV contrast (omnipaque)
Comparison: Comparison made with prior brain MRI and head CT from
earlier same day.

CLINICAL DATA: Initial evaluation for TIA, left-sided weakness.



[Series 5: cta neck · axial · 0.48mm/px · z∈[-239,-123]mm · 2 of 175 slices shown]
[im 59/175  soft-tissue]
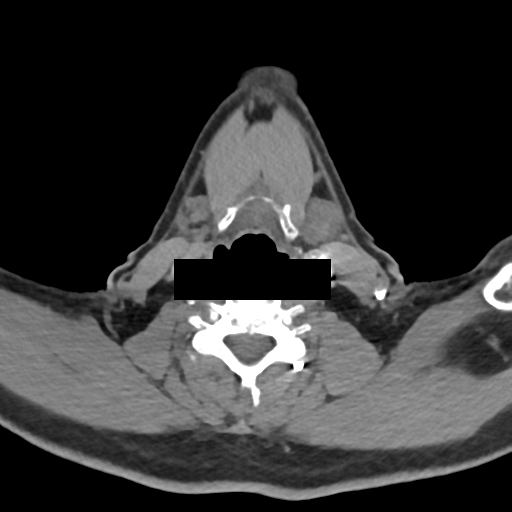
[im 117/175  soft-tissue]
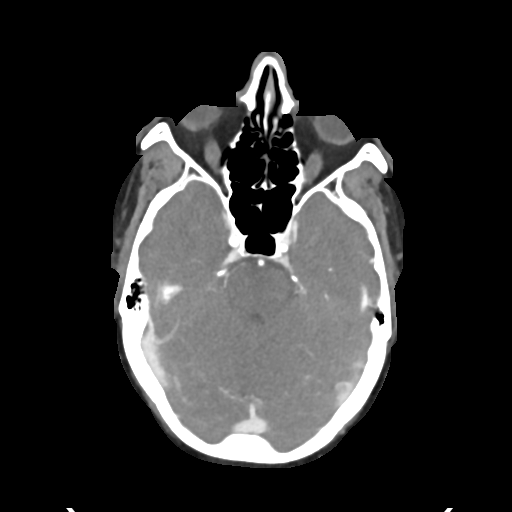

[Series 7: cta neck axial · axial · 0.39mm/px · z∈[-311,-64]mm · 6 of 348 slices shown]
[im 50/348  soft-tissue]
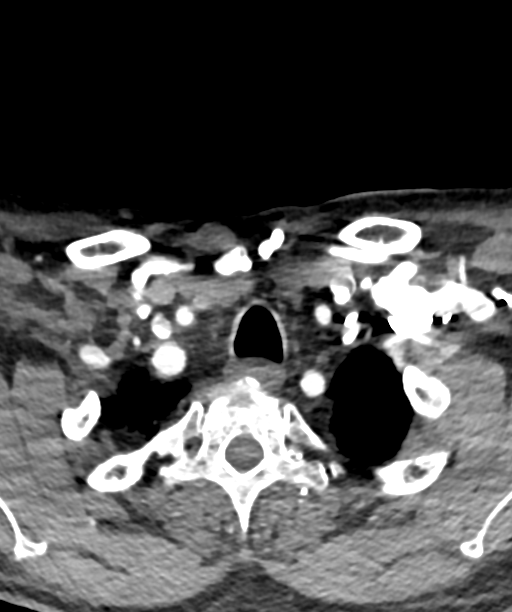
[im 100/348  bone]
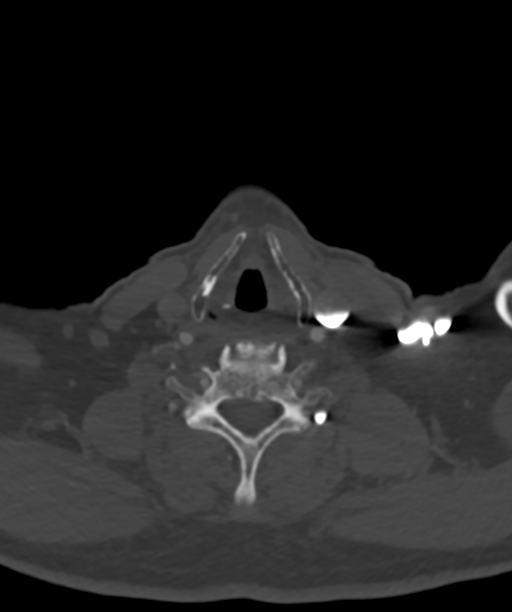
[im 149/348  soft-tissue]
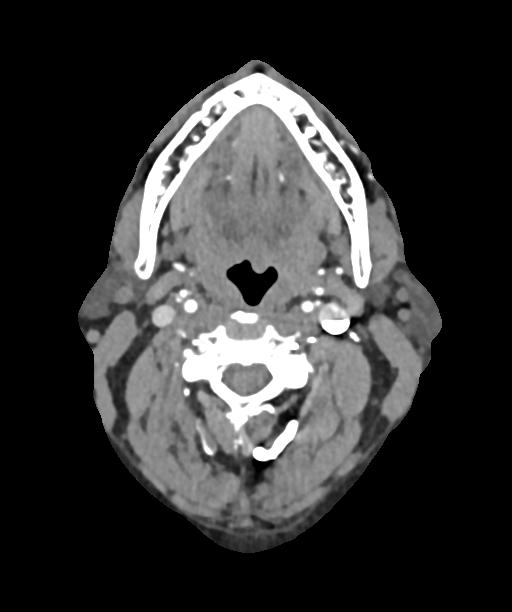
[im 199/348  bone]
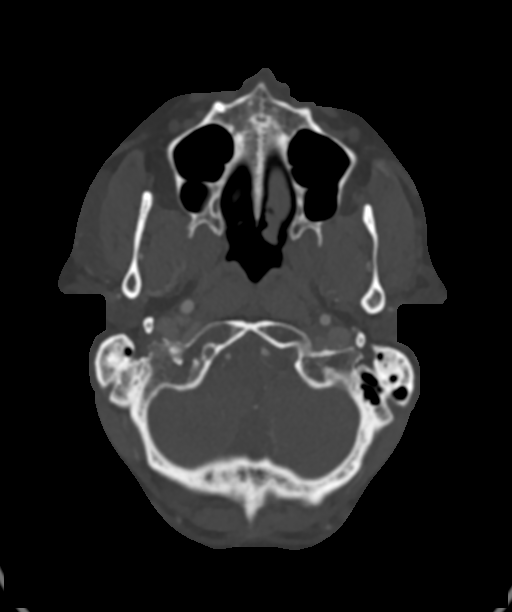
[im 248/348  soft-tissue]
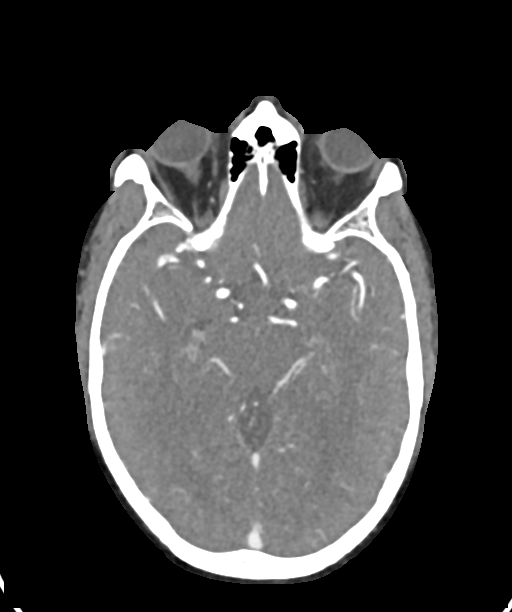
[im 298/348  bone]
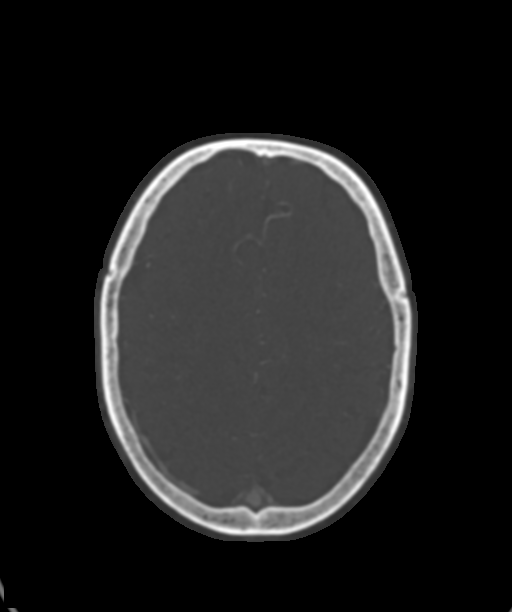

[8 of 33 positions shown; findings below may reference images not displayed]

FINDINGS: CTA NECK FINDINGS

Aortic arch: Visualized aortic arch of normal caliber with normal 3
vessel morphology. No hemodynamically significant stenosis or other
abnormality seen about the origin of the great vessels.

Right carotid system: Scattered eccentric soft plaque seen
throughout the mid and distal right CCA with associated mild
multifocal stenosis. Bulky calcified noncalcified plaque about the
right bifurcation/proximal right ICA with associated stenosis of up
to approximately 35-40% by NASCET criteria. Right ICA otherwise
widely patent distally to the skull base without stenosis,
dissection or occlusion.

Left carotid system: Left CCA patent from its origin to the
bifurcation without stenosis. Mild calcified plaque about the left
bifurcation without hemodynamically significant stenosis. Left ICA
widely patent distally without stenosis, dissection or occlusion.

Vertebral arteries: Both vertebral arteries arise from the
subclavian arteries. No proximal subclavian artery stenosis.
Evaluation of the distal V2/V3 segment somewhat limited by adjacent
venous contamination, worse on the left. Visualized vertebral
arteries widely patent without stenosis, dissection or occlusion.

Skeleton: No acute osseous abnormality. No discrete or worrisome
osseous lesions. Mild multilevel cervical spondylosis without
high-grade stenosis.

Other neck: No other acute soft tissue abnormality within the neck.
No mass lesion or adenopathy.

Upper chest: Visualized upper chest demonstrates no acute finding.

Review of the MIP images confirms the above findings

CTA HEAD FINDINGS

Anterior circulation: Both internal carotid arteries widely patent
to the termini without stenosis or other abnormality. A1 segments
widely patent. Normal anterior communicating artery complex.
Anterior cerebral arteries widely patent to their distal aspects
without stenosis. No M1 stenosis or occlusion. Normal MCA
bifurcations. Distal MCA branches well perfused and symmetric.

Posterior circulation: Both vertebral arteries widely patent to the
vertebrobasilar junction without stenosis. Left vertebral artery
slightly dominant. Both picas patent. Basilar patent to its distal
aspect without stenosis. Superior cerebral arteries patent
bilaterally. Left PCA supplied primarily via the basilar. Right PCA
supplied via a the basilar as well as a robust right posterior
communicating artery. Both PCAs well perfused to their distal
aspects without stenosis.

Venous sinuses: Patent.

Anatomic variants: None significant.  No intracranial aneurysm.

Review of the MIP images confirms the above findings
IMPRESSION: 1. Negative CTA for emergent large vessel occlusion.
2. Bulky next plaque about the right carotid bifurcation/proximal
right ICA with associated stenosis of up to 35-40% by NASCET
criteria. Additional mild multifocal stenoses present throughout the
mid and distal right CCA.
3. Additional mild atherosclerotic change about the left carotid
bifurcation without significant stenosis.
4. Otherwise wide patency of the major arterial vasculature of the
head and neck. No other hemodynamically significant or correctable
stenosis.

## 2019-12-02 MED ORDER — IOHEXOL 350 MG/ML SOLN
80.0000 mL | Freq: Once | INTRAVENOUS | Status: AC | PRN
  Administered 2019-12-02: 80 mL via INTRAVENOUS

## 2019-12-02 MED ORDER — SODIUM CHLORIDE 0.9% FLUSH: 3.0000 mL | Freq: Once | INTRAVENOUS | Status: DC

## 2019-12-02 NOTE — ED Triage Notes (Signed)
Pt says that around 77 today his wife noticed a left sided facial droop and drooling that lasted approximately 1 minute- resolved. Incident occurred again at 1830, pt was eval by EMS after second occurrence. Pt arrived via POV. Pt says he has felt fatigued all day. No facial drooping, clear speech, equal bilateral extremity strength.

## 2019-12-02 NOTE — ED Provider Notes (Signed)
[facility] EMERGENCY DEPARTMENT Provider Note   CSN: [id number] Arrival date & time: 12/02/19  1941     History Chief Complaint  Patient presents with  . Facial Droop    Andrew Mendoza is a 64 y.o. male.  The history is provided by the patient and medical records.   64 year old male with history of GERD, history of throat cancer, hypercholesterolemia, presenting to the ED after 2 episodes of facial droop, speech difficulty, and left leg weakness.  Wife states he got up this morning and was completely normal.  They ran a few errands, were at the grocery store around noon when his face started drooping and he was dragging his left leg.  They immediately left and went to the car as patient stated he wanted to go home within 10 minutes all symptoms have resolved.  He did go and take a 3-hour nap and seemed to be doing okay, but was still very tired.  Wife states they sat down for dinner with her daughter around 6:30PM and he had a second episode of the same symptoms.  Wife states he was actually drooling from his left side, patient was unaware this was happening. Symptoms again resolved within 10 to 15 minutes.  On arrival to ED, patient is asymptomatic.  He has no history of TIA or stroke in the past.  His father did die of a massive stroke at 39.  He does take daily aspirin every other day-- did not take today.  Patient does admit on Thursday (11/28/19) he did experience a little blurred vision in the left eye, that has since resolved and di not recur today.  He also pulled a tick off the right leg last evening, states he did get it off fully intact.  No rash or fever present.  Past Medical History:  Diagnosis Date  . GERD (gastroesophageal reflux disease)   . History of chicken pox   . Hypercholesteremia   . Throat cancer [facility]     Patient Active Problem List   Diagnosis Date Noted  . Shortness of breath 02/22/2013  . Chest pain on exertion 02/22/2013    Past  Surgical History:  Procedure Laterality Date  . COLONOSCOPY WITH PROPOFOL    . COLONOSCOPY WITH PROPOFOL N/A 05/13/2019   Procedure: COLONOSCOPY WITH PROPOFOL;  Surgeon: Toledo, Benay Pike, MD;  Location: [facility] ENDOSCOPY;  Service: Gastroenterology;  Laterality: N/A;  . ESOPHAGOGASTRODUODENOSCOPY (EGD) WITH PROPOFOL N/A 05/13/2019   Procedure: ESOPHAGOGASTRODUODENOSCOPY (EGD) WITH PROPOFOL;  Surgeon: Toledo, Benay Pike, MD;  Location: [facility] ENDOSCOPY;  Service: Gastroenterology;  Laterality: N/A;       Family History  Problem Relation Age of Onset  . Breast cancer Mother   . Stroke Father   . Hypertension Father   . Heart attack Brother 44    Social History   Tobacco Use  . Smoking status: Former Smoker    Packs/day: 1.00    Years: 10.00    Pack years: 10.00    Types: Cigarettes  . Smokeless tobacco: Never Used  Vaping Use  . Vaping Use: Never used  Substance Use Topics  . Alcohol use: Yes    Comment: occassional, none last 24hrs  . Drug use: No    Home Medications Prior to Admission medications   Medication Sig Start Date End Date Taking? Authorizing Provider  aspirin 81 MG tablet Take 81 mg by mouth daily.    [provider]  Multiple Vitamin (MULTIVITAMIN) tablet Take 1 tablet by  mouth daily.    [provider]  Omega-3 Fatty Acids (FISH OIL) 600 MG CAPS Take by mouth daily.    [provider]  omeprazole (PRILOSEC) 40 MG capsule Take 40 mg by mouth daily.  02/12/13   [provider]  SIMVASTATIN PO Take by mouth.    [provider]  vitamin E (VITAMIN E) 400 UNIT capsule Take 800 Units by mouth daily.    [provider]    Allergies    Patient has no known allergies.  Review of Systems   Review of Systems  Neurological: Positive for facial asymmetry.  All other systems reviewed and are negative.   Physical Exam Updated Vital Signs BP (!) 145/79 (BP Location: Right Arm)   Pulse (!) 50   Temp 98.2 F (36.8  C) (Oral)   Resp 18   Ht 5\' 8"  (1.727 m)   Wt 87.1 kg   SpO2 100%   BMI 29.19 kg/m   Physical Exam Vitals and nursing note reviewed.  Constitutional:      General: He is not in acute distress.    Appearance: He is well-developed. He is not diaphoretic.  HENT:     Head: Normocephalic and atraumatic.  Eyes:     Conjunctiva/sclera: Conjunctivae normal.     Pupils: Pupils are equal, round, and reactive to light.     Comments: PERRL, EOMs intact, no nystagmus, normal confrontation, no noted field cuts  Cardiovascular:     Rate and Rhythm: Normal rate and regular rhythm.     Heart sounds: Normal heart sounds.  Pulmonary:     Effort: Pulmonary effort is normal.     Breath sounds: Normal breath sounds. No stridor. No wheezing.  Abdominal:     General: Bowel sounds are normal.     Palpations: Abdomen is soft.     Tenderness: There is no abdominal tenderness. There is no rebound.  Musculoskeletal:        General: Normal range of motion.     Cervical back: Normal range of motion.     Comments: Small red area within right popliteal fossa from tick removal, no noted rash or cellulitic changes  Skin:    General: Skin is warm and dry.  Neurological:     Mental Status: He is alert and oriented to person, place, and time.     Sensory: No sensory deficit.     Motor: No weakness.     Comments: AAOx3, answering questions and following commands appropriately; equal strength UE and LE bilaterally; CN grossly intact; moves all extremities appropriately without ataxia; no focal neuro deficits or facial asymmetry appreciated, speech clear and goal oriented, normal sensation to light touch diffusely     ED Results / Procedures / Treatments   Labs (all labs ordered are listed, but only abnormal results are displayed) Labs Reviewed  COMPREHENSIVE METABOLIC PANEL - Abnormal; Notable for the following components:      Result Value   CO2 21 (*)    Glucose, Bld 141 (*)    All other components  within normal limits  I-STAT CHEM 8, ED - Abnormal; Notable for the following components:   Glucose, Bld 152 (*)    All other components within normal limits  PROTIME-INR  APTT  CBC  DIFFERENTIAL  CBG MONITORING, ED    EKG EKG Interpretation  Date/Time:  Monday December 02 2019 19:51:03 EDT Ventricular Rate:  52 PR Interval:  164 QRS Duration: 98 QT Interval:  446 QTC Calculation:  414 R Axis:   80 Text Interpretation: Sinus bradycardia Incomplete right bundle branch block Borderline ECG No old tracing to compare Confirmed by Dorie Rank [id number]) on 12/02/2019 10:01:34 PM   Radiology CT HEAD WO CONTRAST  Result Date: 12/02/2019 CLINICAL DATA:  Left-sided facial droop and drooling. EXAM: CT HEAD WITHOUT CONTRAST TECHNIQUE: Contiguous axial images were obtained from the base of the skull through the vertex without intravenous contrast. COMPARISON:  None. FINDINGS: Brain: No evidence of acute infarction, hemorrhage, hydrocephalus, extra-axial collection or mass lesion/mass effect. Vascular: No hyperdense vessel or unexpected calcification. Skull: Normal. Negative for fracture or focal lesion. Sinuses/Orbits: No acute finding. Other: None. IMPRESSION: No acute intracranial process. Electronically Signed   By: Zerita Boers M.D.   On: 12/02/2019 20:26    Procedures Procedures (including critical care time)  Medications Ordered in ED Medications  sodium chloride flush (NS) 0.9 % injection 3 mL (has no administration in time range)    ED Course  I have reviewed the triage vital signs and the nursing notes.  Pertinent labs & imaging results that were available during my care of the patient were reviewed by me and considered in my medical decision making (see chart for details).    MDM Rules/Calculators/A&P  64 y.o. M here with 2 episodes of blurred vision, left leg weakness, and garbled speech today.  Both episodes fully resolved within 10-15 minutes.  No hx of TIA or stroke.  On arrival  to ED, patient asymptomatic and neurologic exam is non-focal.  Did report pulling a tick off right leg yesterday, however removed it intact.  No noted fever or rash present.  Labs obtained from triage are reassuring.  CT head negative.  Patients symptoms are concerning for TIA's.  Will consult neurology for recommendations, likely will require admission.  10:43 PM Discussed with Dr. Lorraine Lax-- agrees with admission for TIA work-up.  Can go ahead and get CTA head/neck and MRI brain w/out contrast.  He will consult and provide further recommendations.  Discussed with hospitalist, Dr. Linda Hedges-- will admit for ongoing care.  Final Clinical Impression(s) / ED Diagnoses Final diagnoses:  TIA (transient ischemic attack)    Rx / DC Orders ED Discharge Orders    None       Larene Pickett, PA-C 12/03/19 0309    Dorie Rank, MD 12/03/19 [phone]

## 2019-12-02 NOTE — ED Notes (Signed)
Pt off floor to MRI

## 2019-12-03 ENCOUNTER — Observation Stay (HOSPITAL_COMMUNITY): Payer: No Typology Code available for payment source

## 2019-12-03 DIAGNOSIS — K219 Gastro-esophageal reflux disease without esophagitis: Secondary | ICD-10-CM | POA: Diagnosis not present

## 2019-12-03 DIAGNOSIS — G459 Transient cerebral ischemic attack, unspecified: Secondary | ICD-10-CM | POA: Diagnosis present

## 2019-12-03 LAB — PLATELET FUNCTION ASSAY: Collagen / Epinephrine: 133 seconds (ref 0–193)

## 2019-12-03 LAB — PROTIME-INR
INR: 1.1 (ref 0.8–1.2)
Prothrombin Time: 13.8 seconds (ref 11.4–15.2)

## 2019-12-03 LAB — HEMOGLOBIN A1C
Hgb A1c MFr Bld: 5.2 % (ref 4.8–5.6)
Mean Plasma Glucose: 102.54 mg/dL

## 2019-12-03 LAB — SARS CORONAVIRUS 2 BY RT PCR (HOSPITAL ORDER, PERFORMED IN ~~LOC~~ HOSPITAL LAB): SARS Coronavirus 2: NEGATIVE

## 2019-12-03 LAB — LIPID PANEL
Cholesterol: 174 mg/dL (ref 0–200)
HDL: 53 mg/dL (ref >40)
LDL Cholesterol: 110 mg/dL — ABNORMAL HIGH (ref 0–99)
Total CHOL/HDL Ratio: 3.3 RATIO
Triglycerides: 55 mg/dL (ref <150)
VLDL: 11 mg/dL (ref 0–40)

## 2019-12-03 LAB — HIV ANTIBODY (ROUTINE TESTING W REFLEX): HIV Screen 4th Generation wRfx: NONREACTIVE

## 2019-12-03 LAB — URIC ACID: Uric Acid, Serum: 5.4 mg/dL (ref 3.7–8.6)

## 2019-12-03 LAB — PHOSPHORUS: Phosphorus: 3.4 mg/dL (ref 2.5–4.6)

## 2019-12-03 LAB — LACTATE DEHYDROGENASE: LDH: 127 U/L (ref 98–192)

## 2019-12-03 LAB — APTT: aPTT: 37 seconds — ABNORMAL HIGH (ref 24–36)

## 2019-12-03 LAB — MAGNESIUM: Magnesium: 2.3 mg/dL (ref 1.7–2.4)

## 2019-12-03 IMAGING — MR MR HEAD W/O CM
5 series · 48 of 48 positions shown · non-contrast
Comparison: [DATE]

CLINICAL DATA: Stroke follow-up.  Axiomatic stroke study protocol.

EXAM:
MRI HEAD WITHOUT CONTRAST
TECHNIQUE: Multiplanar, multiecho pulse sequences of the brain and surrounding
structures were obtained without intravenous contrast.

[Series 2: DWI · axial · 5.0mm · 0.94mm/px · z∈[-36,+144]mm · 16 of 76 slices shown]
[im 1/76]
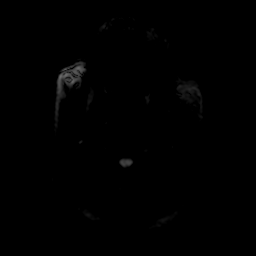
[im 6/76]
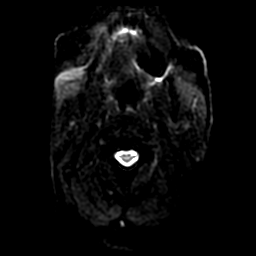
[im 11/76]
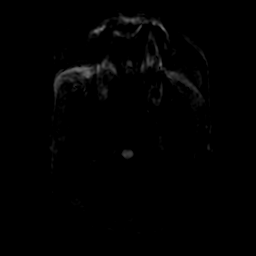
[im 16/76]
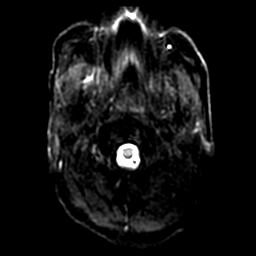
[im 21/76]
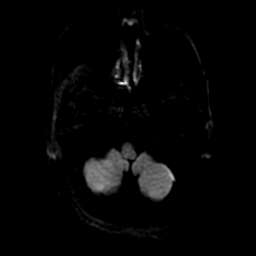
[im 26/76]
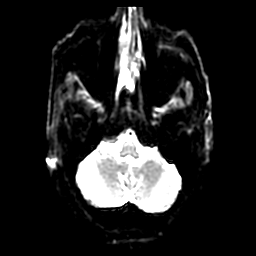
[im 31/76]
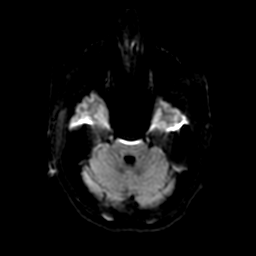
[im 36/76]
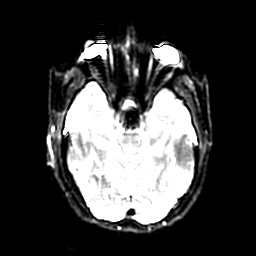
[im 41/76]
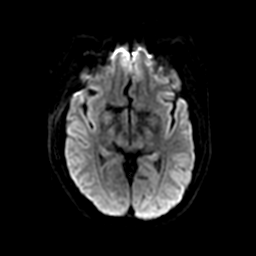
[im 46/76]
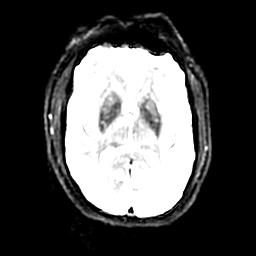
[im 51/76]
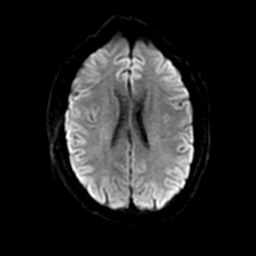
[im 56/76]
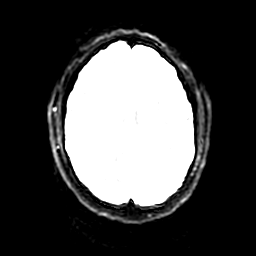
[im 61/76]
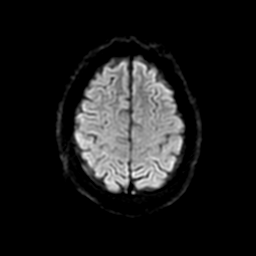
[im 66/76]
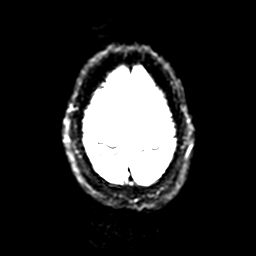
[im 71/76]
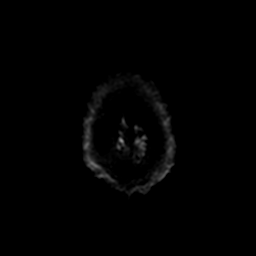
[im 76/76]
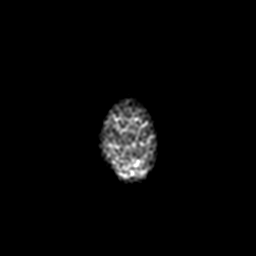

[Series 3: FLAIR · axial · 5.0mm · 0.94mm/px · z∈[-36,+148]mm · 8 of 39 slices shown]
[im 1/39]
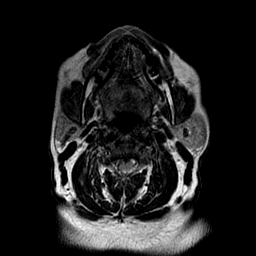
[im 6/39]
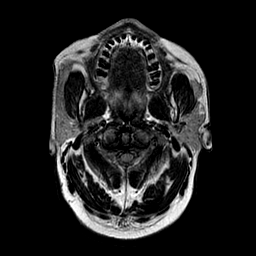
[im 11/39]
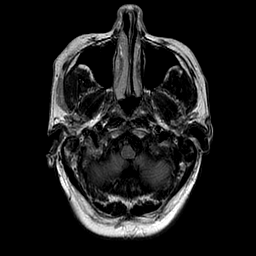
[im 17/39]
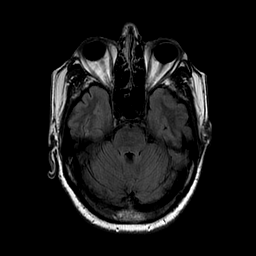
[im 22/39]
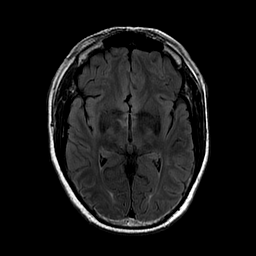
[im 28/39]
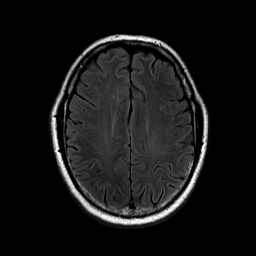
[im 33/39]
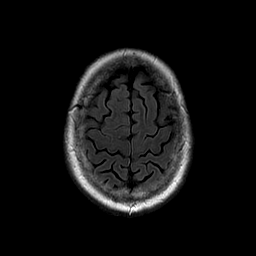
[im 39/39]
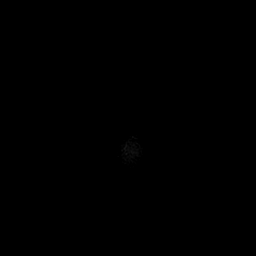

[Series 4: T2-star · axial · 5.0mm · 0.94mm/px · z∈[-36,+148]mm · 8 of 39 slices shown]
[im 1/39]
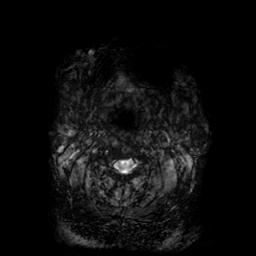
[im 6/39]
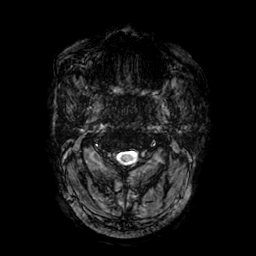
[im 11/39]
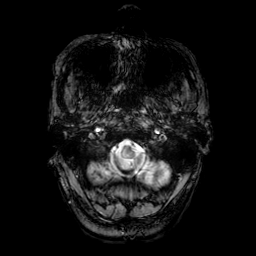
[im 17/39]
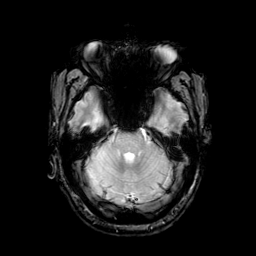
[im 22/39]
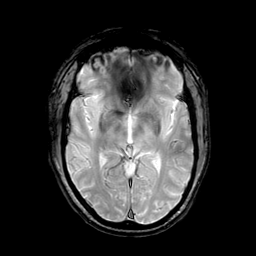
[im 28/39]
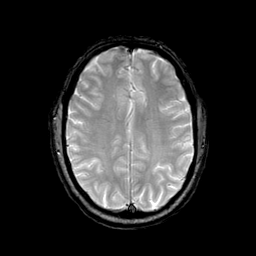
[im 33/39]
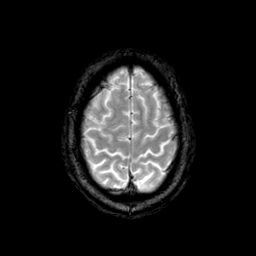
[im 39/39]
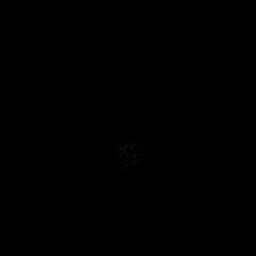

[Series 5: T1 · axial · 5.0mm · 0.94mm/px · z∈[-36,+148]mm · 8 of 39 slices shown]
[im 1/39]
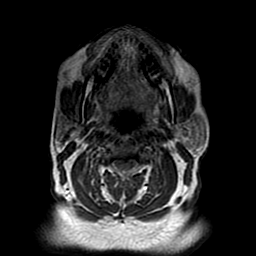
[im 6/39]
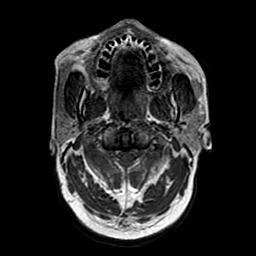
[im 11/39]
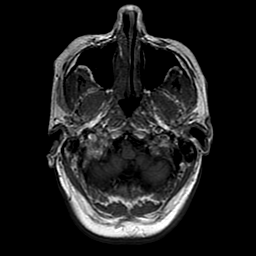
[im 17/39]
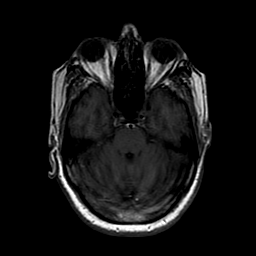
[im 22/39]
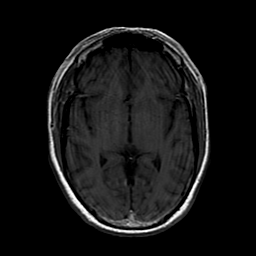
[im 28/39]
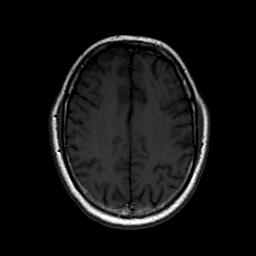
[im 33/39]
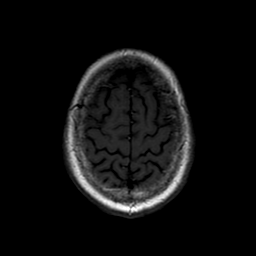
[im 39/39]
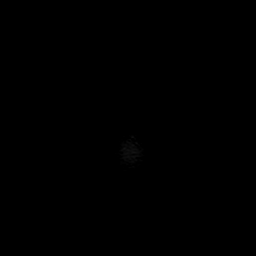

[Series 250: ADC · axial · 5.0mm · 0.94mm/px · z∈[-36,+144]mm · 8 of 37 slices shown]
[im 1/37]
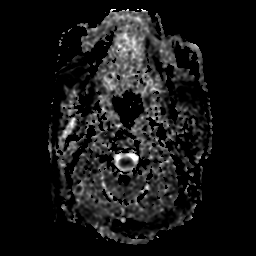
[im 6/37]
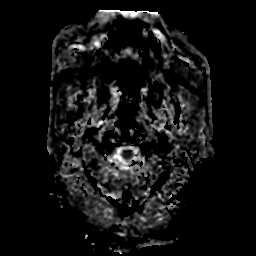
[im 11/37]
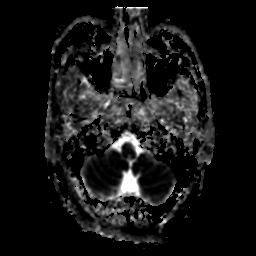
[im 16/37]
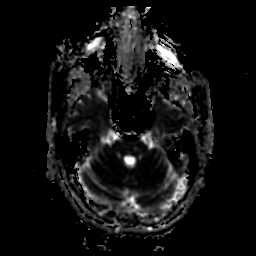
[im 21/37]
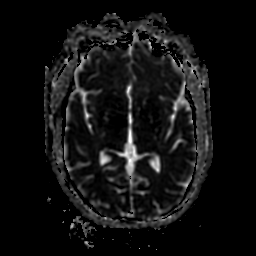
[im 26/37]
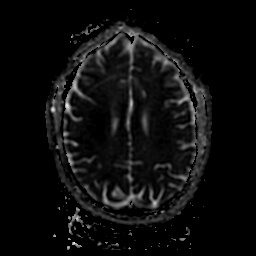
[im 31/37]
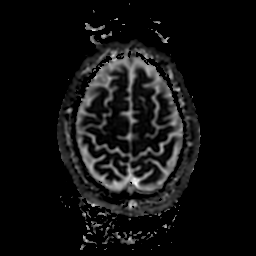
[im 37/37]
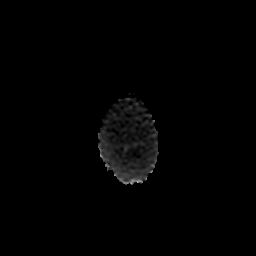

[48 of 48 positions shown; findings below may reference images not displayed]

FINDINGS: Brain: There is a small focus of abnormal diffusion restriction at
the anterior right thalamus, new since the prior study. No other
diffusion abnormality. Normal white matter signal. Normal volume of
CSF spaces. No chronic microhemorrhage. Normal midline structures.

Vascular: Normal flow voids.

Skull and upper cervical spine: Normal marrow signal.

Sinuses/Orbits: Negative.

Other: None.
IMPRESSION: Small focus of acute ischemia at the anterior right thalamus, new
since the prior study.

## 2019-12-03 IMAGING — CT CT HEAD CODE STROKE
3 series · 14 of 47 positions shown, 16 images · non-contrast
Comparison: CT [DATE].  MRI [DATE].

CLINICAL DATA: Code stroke.  Left facial droop and slurred speech.

EXAM:
CT HEAD WITHOUT CONTRAST
TECHNIQUE: Contiguous axial images were obtained from the base of the skull
through the vertex without intravenous contrast.

[Series 3: head 5.0 st · axial · 0.44mm/px · z∈[-91,+49]mm · 8 of 34 slices shown, 10 images]
[im 3/34  brain]
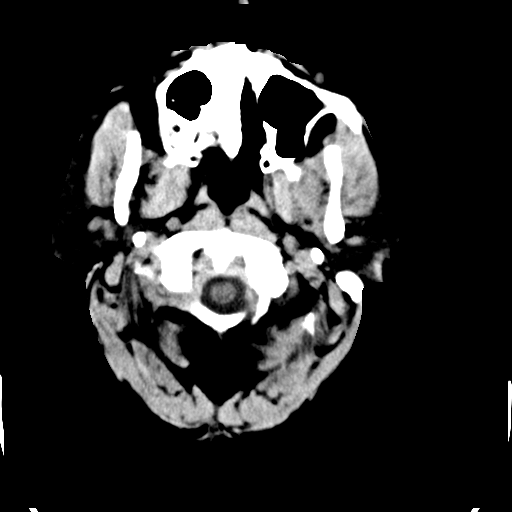
[im 3/34  bone]
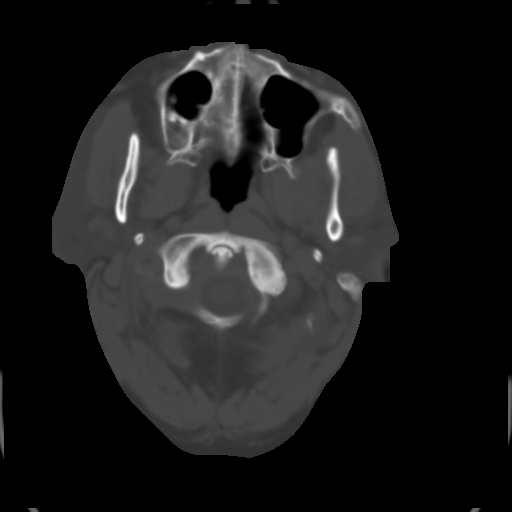
[im 7/34  brain]
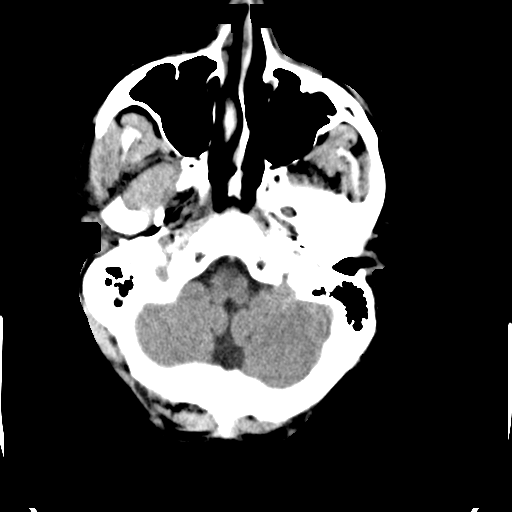
[im 11/34  brain]
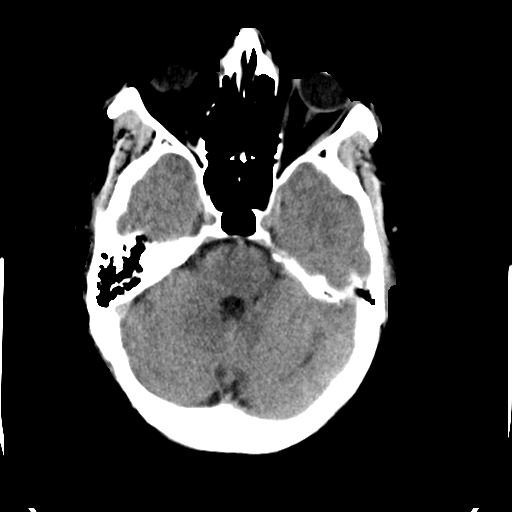
[im 15/34  brain]
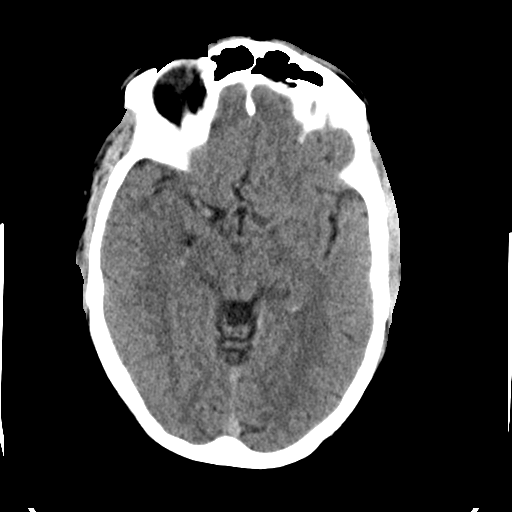
[im 19/34  brain]
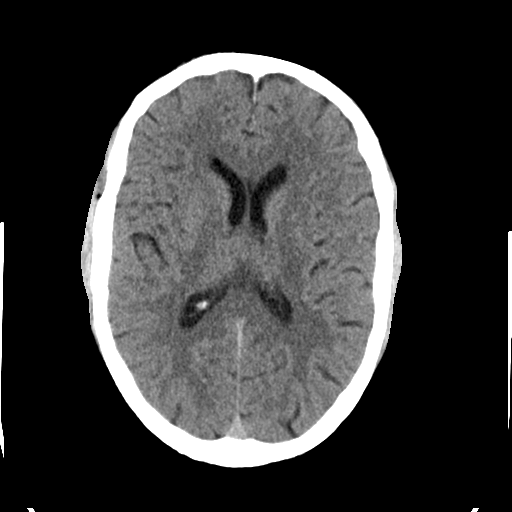
[im 19/34  bone]
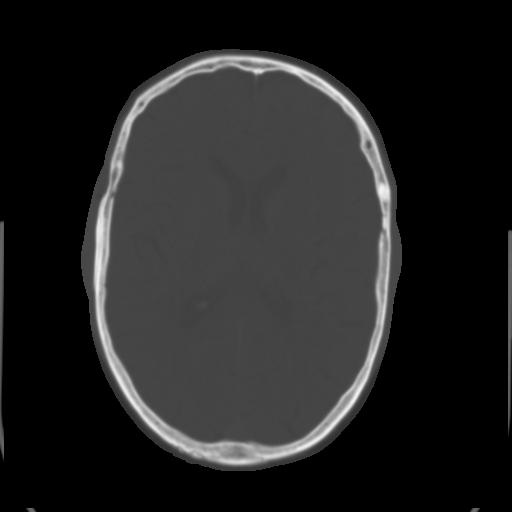
[im 23/34  brain]
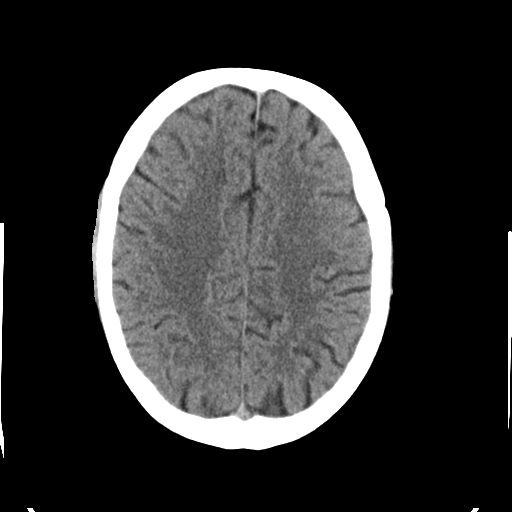
[im 27/34  brain]
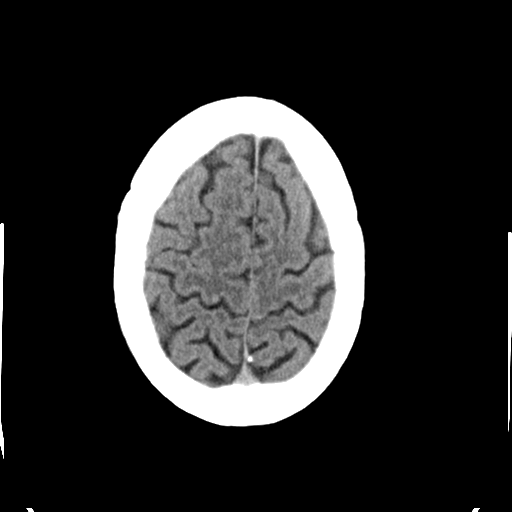
[im 31/34  brain]
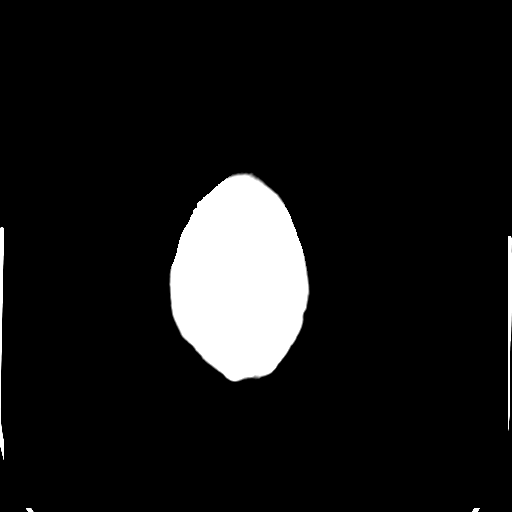

[Series 5: head 3.0 cor st · coronal · 0.29mm/px · 3 of 67 slices shown]
[im 23/67  brain]
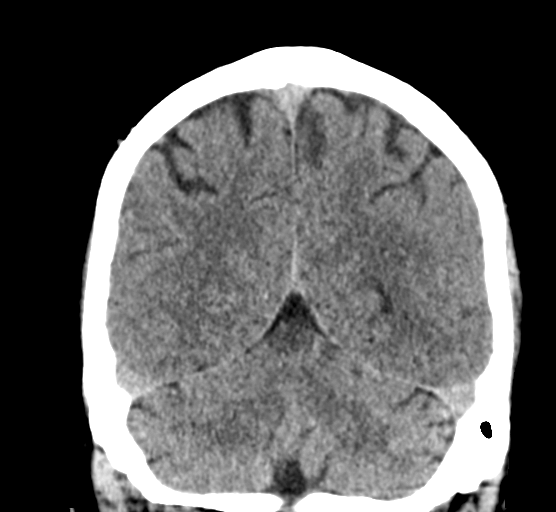
[im 30/67  brain]
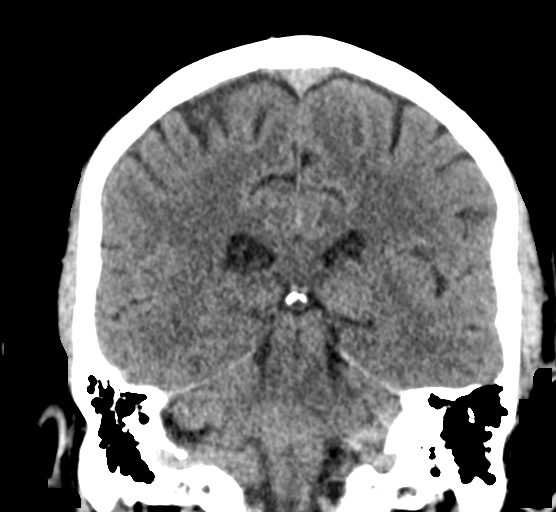
[im 37/67  brain]
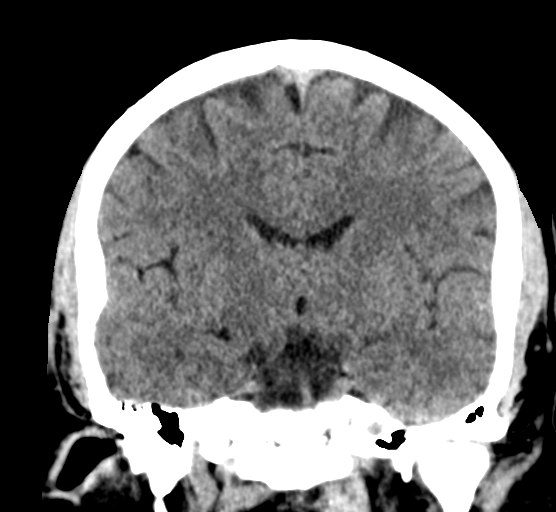

[Series 6: head 3.0 sag st · sagittal · 0.23mm/px · 3 of 67 slices shown]
[im 23/67  brain]
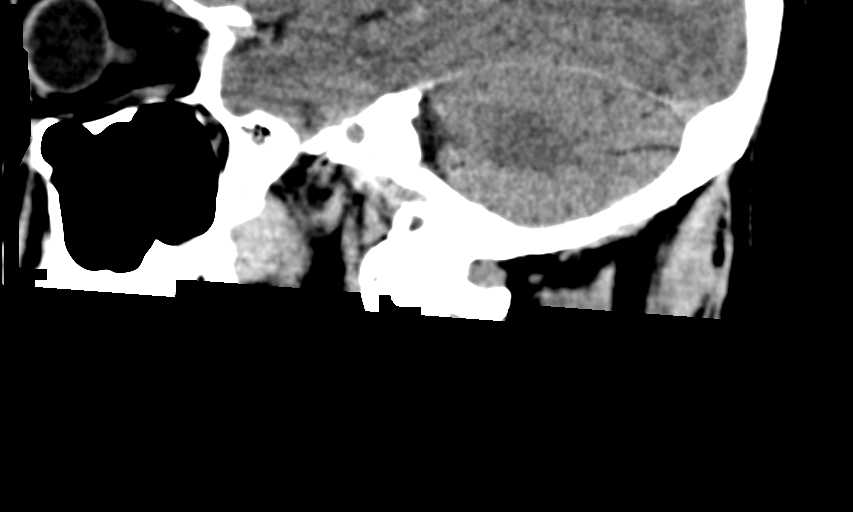
[im 34/67  brain]
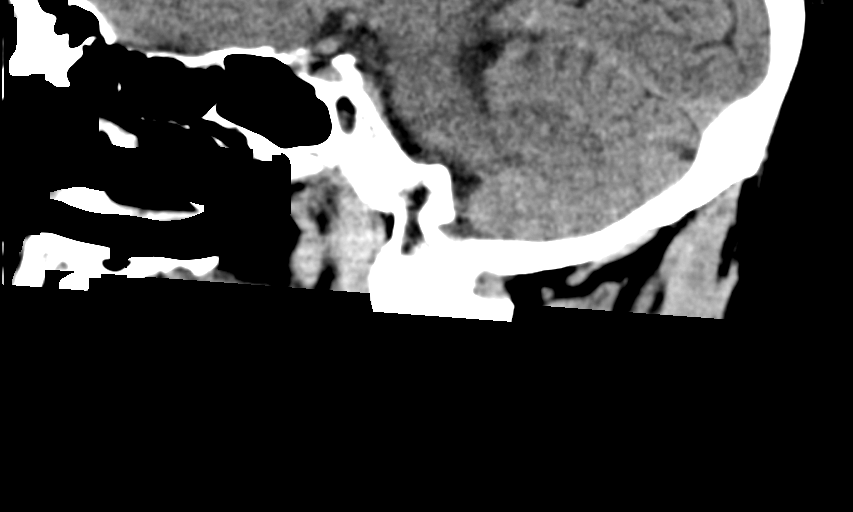
[im 45/67  brain]
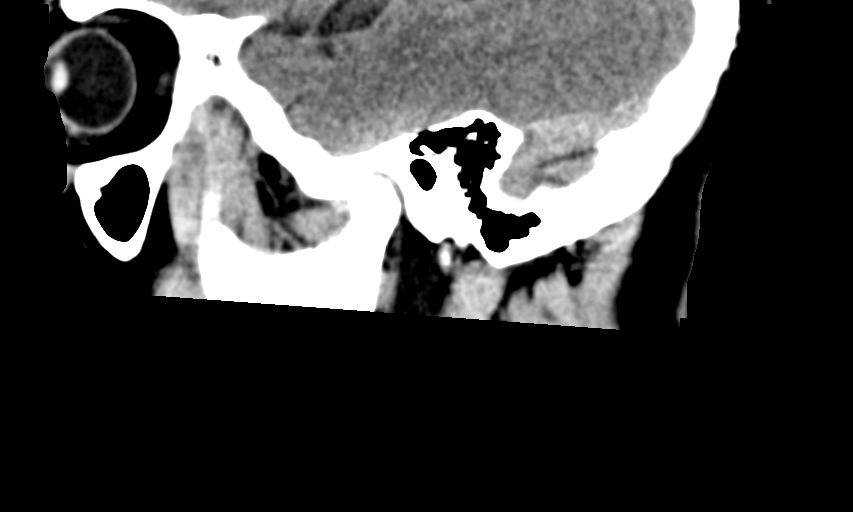

[14 of 47 positions shown; findings below may reference images not displayed]

FINDINGS: Brain: Normal appearance without evidence of old or acute
infarction, mass lesion, hemorrhage, hydrocephalus or extra-axial
collection.

Vascular: Minor calcification of the major vessels at the base of
the brain.

Skull: Negative

Sinuses/Orbits: Clear/normal

Other: None

ASPECTS (Alberta Stroke Program Early CT Score)

- Ganglionic level infarction (caudate, lentiform nuclei, internal
capsule, insula, M1-M3 cortex): 7

- Supraganglionic infarction (M4-M6 cortex): 3

Total score (0-10 with 10 being normal): 10
IMPRESSION: 1. Normal head CT
2. ASPECTS is 10
3. These results were communicated to Dr. SEARCHWELL at [DATE] pmon
[DATE]by text page via the [employer] messaging system.

## 2019-12-03 IMAGING — CT CT ANGIO NECK
3 of 7 series · 10 of 36 positions shown · IV contrast (OMNI 350)
Comparison: Head CT earlier same day.  CT angiography [DATE].

CLINICAL DATA: Left facial droop.  Slurred speech.

EXAM:
CT ANGIOGRAPHY HEAD AND NECK
TECHNIQUE: Multidetector CT imaging of the head and neck was performed using
the standard protocol during bolus administration of intravenous
contrast. Multiplanar CT image reconstructions and MIPs were
obtained to evaluate the vascular anatomy. Carotid stenosis
measurements (when applicable) are obtained utilizing NASCET
criteria, using the distal internal carotid diameter as the
denominator.
CONTRAST:  75mL OMNIPAQUE IOHEXOL 350 MG/ML SOLN

[Series 5: cta neck · axial · 0.41mm/px · z∈[-182,-66]mm · 2 of 175 slices shown]
[im 59/175  soft-tissue]
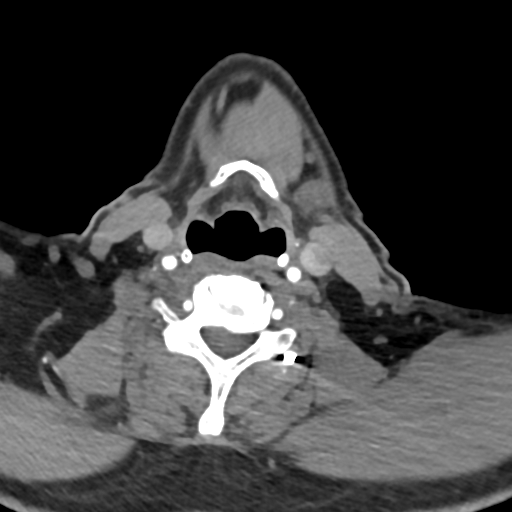
[im 117/175  soft-tissue]
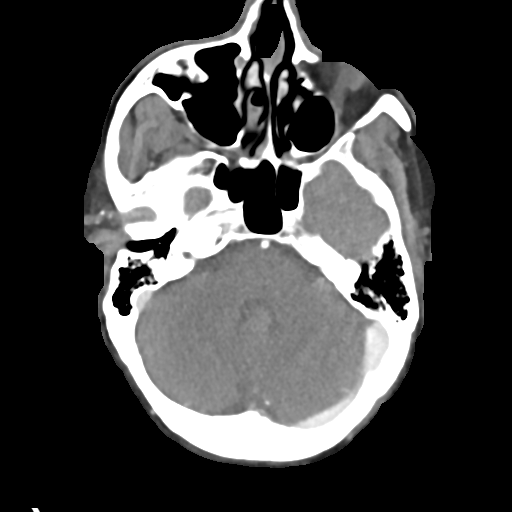

[Series 7: cta neck axial · axial · 0.39mm/px · z∈[-233,+12]mm · 6 of 346 slices shown]
[im 50/346  soft-tissue]
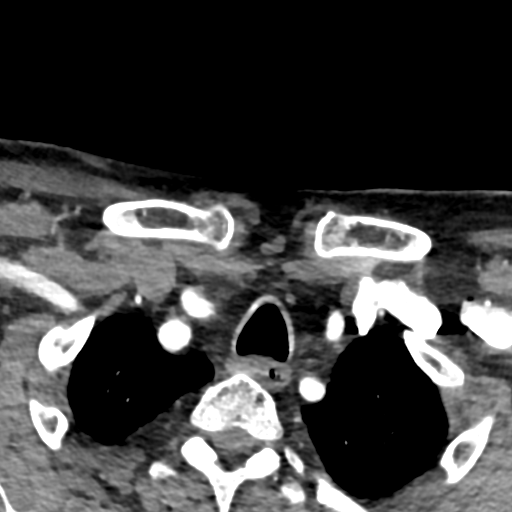
[im 99/346  bone]
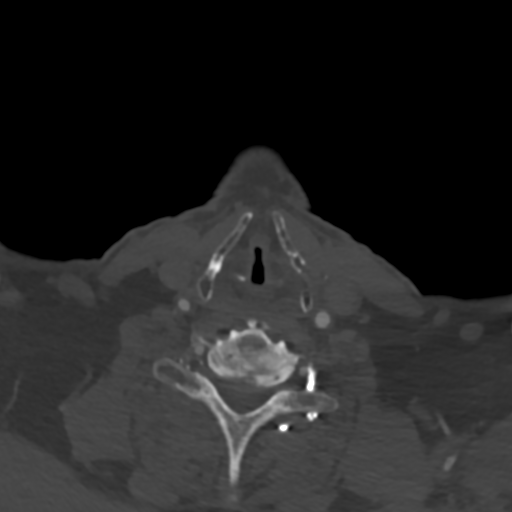
[im 148/346  soft-tissue]
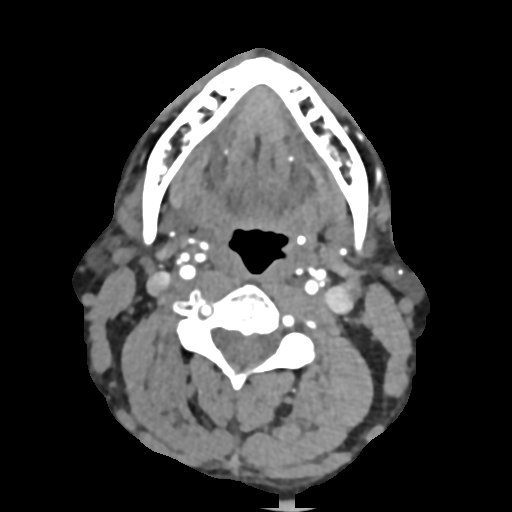
[im 198/346  bone]
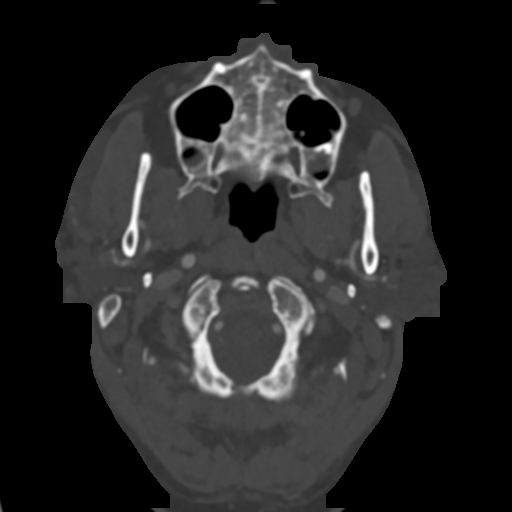
[im 247/346  soft-tissue]
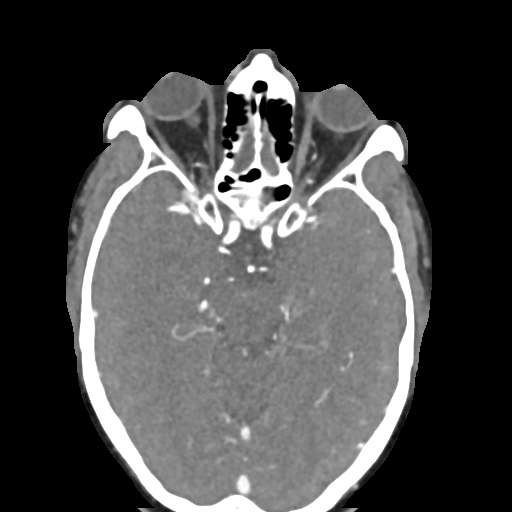
[im 296/346  bone]
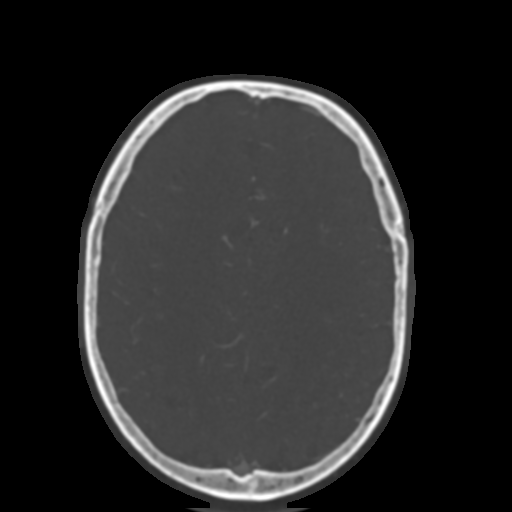

[Series 9: cta neck sagittal · sagittal · 0.46mm/px · 2 of 181 slices shown]
[im 47/181  soft-tissue]
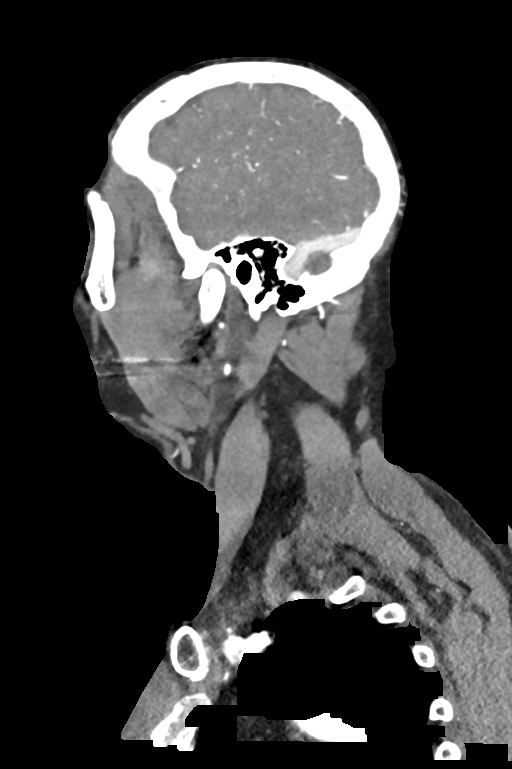
[im 135/181  soft-tissue]
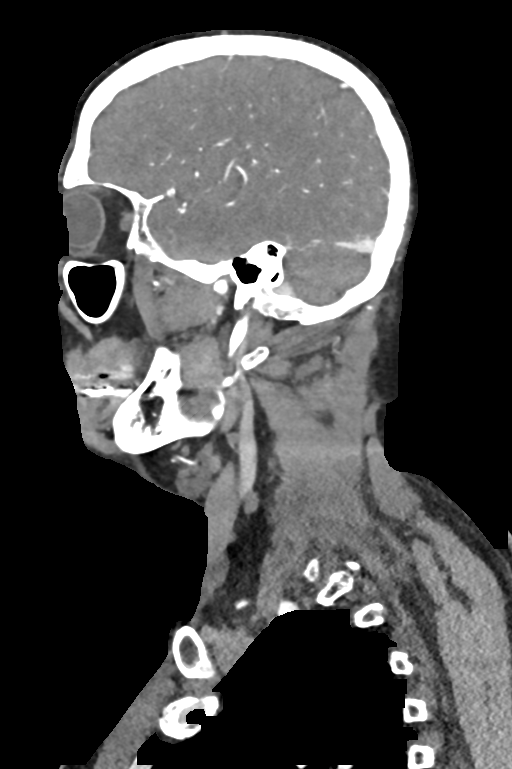

[10 of 36 positions shown; findings below may reference images not displayed]

FINDINGS: CTA NECK FINDINGS

Aortic arch: No significant atherosclerotic change. Branching
pattern is normal. No origin stenosis.

Right carotid system: Common carotid artery widely patent
proximally. Soft plaque affecting the distal common carotid artery
over the last several cm with stenosis of 30% compared to the
expected diameter of the common carotid artery. At the carotid
bifurcation there is soft and calcified plaque. Minimal diameter of
the proximal ICA cannot be measured below that the diameter of the
cervical ICA in therefore I can not described a stenosis. Beyond the
bulb, the cervical ICA is widely patent.

Left carotid system: Common carotid artery shows mild
atherosclerotic plaque but no stenosis. There is an ulcerated
appearance within the soft plaque at the C5-6 level. At the carotid
bifurcation, there is soft and calcified plaque but no stenosis.
Cervical ICA widely patent.

Vertebral arteries: Both vertebral artery origins are widely patent.
Both vertebral arteries appear normal through the cervical region to
the foramen magnum.

Skeleton: Ordinary cervical spondylosis.

Other neck: No mass or lymphadenopathy.

Upper chest: Negative

Review of the MIP images confirms the above findings

CTA HEAD FINDINGS

Anterior circulation: Both internal carotid arteries widely patent
through the skull base and siphon regions. No siphon stenosis. The
anterior and middle cerebral vessels are patent. No large or medium
vessel occlusion or correctable proximal stenosis. Large right
posterior communicating artery.

Posterior circulation: Both vertebral arteries widely patent to the
basilar. No basilar stenosis. Posterior circulation branch vessels
are patent and normal.

Venous sinuses: Patent and normal.

Anatomic variants: None significant otherwise.

Review of the MIP images confirms the above findings
IMPRESSION: No intracranial large or medium vessel occlusion.

Atherosclerotic change affecting both common carotid arteries. 30%
narrowing of the distal several cm of the right common carotid
artery due to soft plaque. Soft plaque in the left common carotid
artery shows mild ulceration at the C5-6 level.

Atherosclerotic disease at both carotid bifurcation and ICA bulb
regions but no measurable stenosis compared to the diameter of the
more distal cervical ICA.

These results were communicated to Dr. OSAMA at [DATE] pmon [DATE]by
text page via the [employer] messaging system.

## 2019-12-03 MED ORDER — SODIUM CHLORIDE 0.9 % IV SOLN: Freq: Once | INTRAVENOUS | Status: AC

## 2019-12-03 MED ORDER — PANTOPRAZOLE SODIUM 40 MG PO TBEC
40.0000 mg | DELAYED_RELEASE_TABLET | Freq: Every day | ORAL | Status: DC
  Administered 2019-12-03 – 2019-12-04 (×2): 40 mg via ORAL
  Filled 2019-12-03 (×2): qty 1

## 2019-12-03 MED ORDER — ACETAMINOPHEN 325 MG PO TABS: 650.0000 mg | ORAL_TABLET | ORAL | Status: DC | PRN

## 2019-12-03 MED ORDER — SODIUM CHLORIDE 0.9 % IV SOLN: INTRAVENOUS | Status: AC

## 2019-12-03 MED ORDER — ENOXAPARIN SODIUM 40 MG/0.4ML ~~LOC~~ SOLN: 40.0000 mg | SUBCUTANEOUS | Status: DC

## 2019-12-03 MED ORDER — STUDY - AXIOMATIC STUDY - BMS986177 OR PLACEBO (PI-SETHI)
2.0000 | ORAL_CAPSULE | Freq: Two times a day (BID) | ORAL | Status: DC
  Administered 2019-12-03 – 2019-12-04 (×2): 2 via ORAL
  Filled 2019-12-03 (×3): qty 2

## 2019-12-03 MED ORDER — STUDY - AXIOMATIC STUDY - ASPIRIN 100 MG (PI-SETHI)
100.0000 mg | ORAL_TABLET | Freq: Every day | ORAL | Status: DC
  Administered 2019-12-03 – 2019-12-04 (×2): 100 mg via ORAL
  Filled 2019-12-03 (×2): qty 100

## 2019-12-03 MED ORDER — STUDY - AXIOMATIC STUDY - CLOPIDOGREL 75MG (PI-SETHI)
75.0000 mg | ORAL_TABLET | ORAL | Status: DC
  Administered 2019-12-03 – 2019-12-04 (×2): 75 mg via ORAL
  Filled 2019-12-03: qty 75

## 2019-12-03 MED ORDER — CLOPIDOGREL BISULFATE 300 MG PO TABS
300.0000 mg | ORAL_TABLET | Freq: Once | ORAL | Status: AC
  Administered 2019-12-03: 300 mg via ORAL
  Filled 2019-12-03: qty 1

## 2019-12-03 MED ORDER — ENOXAPARIN SODIUM 40 MG/0.4ML ~~LOC~~ SOLN
40.0000 mg | SUBCUTANEOUS | Status: DC
  Administered 2019-12-03: 40 mg via SUBCUTANEOUS
  Filled 2019-12-03 (×2): qty 0.4

## 2019-12-03 MED ORDER — SODIUM CHLORIDE 0.9 % IV BOLUS
500.0000 mL | Freq: Once | INTRAVENOUS | Status: AC
  Administered 2019-12-03: 500 mL via INTRAVENOUS

## 2019-12-03 MED ORDER — SIMVASTATIN 20 MG PO TABS: 20.0000 mg | ORAL_TABLET | Freq: Every day | ORAL | Status: DC

## 2019-12-03 MED ORDER — ACETAMINOPHEN 160 MG/5ML PO SOLN: 650.0000 mg | ORAL | Status: DC | PRN

## 2019-12-03 MED ORDER — ASPIRIN EC 81 MG PO TBEC
81.0000 mg | DELAYED_RELEASE_TABLET | Freq: Every day | ORAL | Status: DC
  Administered 2019-12-03: 81 mg via ORAL
  Filled 2019-12-03: qty 1

## 2019-12-03 MED ORDER — CLOPIDOGREL BISULFATE 75 MG PO TABS: 75.0000 mg | ORAL_TABLET | Freq: Every day | ORAL | Status: DC

## 2019-12-03 MED ORDER — ASPIRIN EC 325 MG PO TBEC: 325.0000 mg | DELAYED_RELEASE_TABLET | ORAL | Status: DC

## 2019-12-03 MED ORDER — IOHEXOL 350 MG/ML SOLN
75.0000 mL | Freq: Once | INTRAVENOUS | Status: AC | PRN
  Administered 2019-12-03: 75 mL via INTRAVENOUS

## 2019-12-03 MED ORDER — STROKE: EARLY STAGES OF RECOVERY BOOK
Freq: Once | Status: AC
  Administered 2019-12-03: 1
  Filled 2019-12-03: qty 1

## 2019-12-03 MED ORDER — VITAMIN B-12 1000 MCG PO TABS
1000.0000 ug | ORAL_TABLET | Freq: Every day | ORAL | Status: DC
  Administered 2019-12-03 – 2019-12-04 (×2): 1000 ug via ORAL
  Filled 2019-12-03 (×2): qty 1

## 2019-12-03 MED ORDER — ACETAMINOPHEN 650 MG RE SUPP: 650.0000 mg | RECTAL | Status: DC | PRN

## 2019-12-03 MED ORDER — ATORVASTATIN CALCIUM 80 MG PO TABS
80.0000 mg | ORAL_TABLET | Freq: Every day | ORAL | Status: DC
  Administered 2019-12-03 – 2019-12-04 (×2): 80 mg via ORAL
  Filled 2019-12-03 (×2): qty 1

## 2019-12-03 MED ORDER — SENNOSIDES-DOCUSATE SODIUM 8.6-50 MG PO TABS: 1.0000 | ORAL_TABLET | Freq: Every evening | ORAL | Status: DC | PRN

## 2019-12-03 MED ORDER — ASPIRIN EC 81 MG PO TBEC: 81.0000 mg | DELAYED_RELEASE_TABLET | ORAL | Status: DC

## 2019-12-03 NOTE — Progress Notes (Signed)
EEG Completed; Results Pending  

## 2019-12-03 NOTE — Evaluation (Signed)
Physical Therapy Evaluation Patient Details Name: Andrew Mendoza MRN: [id number] DOB: [birth year] Today's Date: 12/03/2019   History of Present Illness  64 yo male with onset of TIA with L side weakness was referred to PT in ED.  Has normal EEG with no seizure activity, no changes on MRI of head.  PMHx:  lacunar and L lentiform nucleus, L thalamus infarcts, HLD, HTN, GERD, throat CA64 yo male with onset of TIA with L side weakness was referred to PT in ED.  Has normal EEG with no seizure activity, no changes on MRI of head.  PMHx:  lacunar and L lentiform nucleus, L thalamus infarcts, HLD, HTN, GERD, throat CA64 yo male with onset of TIA with L side weakness was referred to PT in ED.  Has normal EEG with no seizure activity, no changes on MRI of head.  PMHx:  lacunar and L lentiform nucleus, L thalamus infarcts, HLD, HTN, GERD, throat CA  Clinical Impression  Pt is able to \\walk  without an AD but has an independent living situation with stairs.  Will see for therapy another visit to work on more balance challenges with stairs and to ensure he is ready to be home.  Pt is in agreement with plan of care, and is ready to go home when cleared medically.     Follow Up Recommendations No PT follow up    Equipment Recommendations  None recommended by PT    Recommendations for Other Services       Precautions / Restrictions Precautions Precautions: None Precaution Comments: L side weakness Restrictions Weight Bearing Restrictions: No      Mobility  Bed Mobility Overal bed mobility: Modified Independent                Transfers Overall transfer level: Modified independent Equipment used: None                Ambulation/Gait Ambulation/Gait assistance: Supervision Gait Distance (Feet): 250 Feet Assistive device: None Gait Pattern/deviations: Step-through pattern;Decreased stride length;Wide base of support Gait velocity: controlled   General Gait Details: no LOB was  observed on this walk  Stairs            Wheelchair Mobility    Modified Rankin (Stroke Patients Only) Modified Rankin (Stroke Patients Only) Pre-Morbid Rankin Score: No symptoms Modified Rankin: Slight disability     Balance Overall balance assessment: Needs assistance Sitting-balance support: Feet supported Sitting balance-Leahy Scale: Good Sitting balance - Comments: controlled to sit unsupported on gurney in ED     Standing balance-Leahy Scale: Fair                               Pertinent Vitals/Pain Pain Assessment: No/denies pain    Home Living Family/patient expects to be discharged to:: Private residence Living Arrangements: Alone Available Help at Discharge: Family;Friend(s) Type of Home: House Home Access: Stairs to enter Entrance Stairs-Rails: None Entrance Stairs-Number of Steps: 2 Home Layout: Two level;Able to live on main level with bedroom/bathroom Home Equipment: None Additional Comments: does not have to live on upstairs of home,     Prior Function Level of Independence: Independent               Hand Dominance   Dominant Hand: Right    Extremity/Trunk Assessment   Upper Extremity Assessment Upper Extremity Assessment: Overall WFL for tasks assessed    Lower Extremity Assessment Lower Extremity Assessment: Overall WFL for tasks  assessed (strength is 4+ to 5)    Cervical / Trunk Assessment Cervical / Trunk Assessment: Kyphotic (mild)  Communication   Communication: No difficulties  Cognition Arousal/Alertness: Awake/alert Behavior During Therapy: WFL for tasks assessed/performed Overall Cognitive Status: Within Functional Limits for tasks assessed                                        General Comments General comments (skin integrity, edema, etc.): pt is demonstrating ability to stand and balance wiht IV, telemetry wires and with distractions    Exercises     Assessment/Plan    PT  Assessment Patient needs continued PT services  PT Problem List Decreased activity tolerance;Decreased balance       PT Treatment Interventions Gait training;DME instruction;Stair training;Functional mobility training;Therapeutic activities;Therapeutic exercise;Balance training;Neuromuscular re-education;Patient/family education    PT Goals (Current goals can be found in the Care Plan section)  Acute Rehab PT Goals Patient Stated Goal: resolve weakness PT Goal Formulation: With patient Time For Goal Achievement: 12/10/19 Potential to Achieve Goals: Good    Frequency Min 3X/week   Barriers to discharge Decreased caregiver support home alone    Co-evaluation               AM-PAC PT "6 Clicks" Mobility  Outcome Measure Help needed turning from your back to your side while in a flat bed without using bedrails?: None Help needed moving from lying on your back to sitting on the side of a flat bed without using bedrails?: None Help needed moving to and from a bed to a chair (including a wheelchair)?: A Little Help needed standing up from a chair using your arms (e.g., wheelchair or bedside chair)?: A Little Help needed to walk in hospital room?: A Little Help needed climbing 3-5 steps with a railing? : A Little 6 Click Score: 20    End of Session   Activity Tolerance: Patient tolerated treatment well;Treatment limited secondary to medical complications (Comment) Patient left: in bed;with call bell/phone within reach Nurse Communication: Mobility status PT Visit Diagnosis: Other abnormalities of gait and mobility (R26.89)    Time: 1610-9604 PT Time Calculation (min) (ACUTE ONLY): 33 min   Charges:   PT Evaluation $PT Eval Moderate Complexity: 1 Mod PT Treatments $Gait Training: 8-22 mins       Ramond Dial 12/03/2019, 7:37 PM  Mee Hives, PT MS Acute Rehab Dept. Number: [place] and [place]

## 2019-12-03 NOTE — Plan of Care (Signed)
  Problem: Education: Goal: Knowledge of disease or condition will improve Outcome: Progressing Goal: Knowledge of secondary prevention will improve Outcome: Progressing Goal: Knowledge of patient specific risk factors addressed and post discharge goals established will improve Outcome: Progressing Goal: Individualized Educational Video(s) Outcome: Progressing   Problem: Coping: Goal: Will verbalize positive feelings about self Outcome: Progressing Goal: Will identify appropriate support needs Outcome: Progressing   Problem: Health Behavior/Discharge Planning: Goal: Ability to manage health-related needs will improve Outcome: Progressing   Problem: Self-Care: Goal: Ability to participate in self-care as condition permits will improve Outcome: Progressing Goal: Verbalization of feelings and concerns over difficulty with self-care will improve Outcome: Progressing   Problem: Ischemic Stroke/TIA Tissue Perfusion: Goal: Complications of ischemic stroke/TIA will be minimized Outcome: Progressing

## 2019-12-03 NOTE — Procedures (Signed)
ELECTROENCEPHALOGRAM REPORT   Patient: Andrew Mendoza       Room #: 010O EEG No. ID: [id number] Age: 64 y.o.        Sex: male Requesting Physician: Earnest Conroy Report Date:  12/03/2019        Interpreting Physician: Alexis Goodell  History: Andrew Mendoza is an 64 y.o. male with episode of facial droop and difficulty with gait  Medications:  ASA, Lipitor  Conditions of Recording:  This is a 21 channel routine scalp EEG performed with bipolar and monopolar montages arranged in accordance to the international 10/20 system of electrode placement. One channel was dedicated to EKG recording.  The patient is in the awake, drowsy and asleep states.  Description:  The waking background activity consists of a low voltage, symmetrical, fairly well organized, 10 Hz alpha activity, seen from the parieto-occipital and posterior temporal regions.  Low voltage fast activity, poorly organized, is seen anteriorly and is at times superimposed on more posterior regions.  A mixture of theta and alpha rhythms are seen from the central and temporal regions. The patient drowses with slowing to irregular, low voltage theta and beta activity.   The patient goes in to a light sleep with symmetrical sleep spindles, vertex central sharp transients and irregular slow activity. No epileptiform activity is noted.     Hyperventilation and intermittent photic stimulation were not performed.   IMPRESSION: Normal electroencephalogram, awake, asleep and with activation procedures. There are no focal lateralizing or epileptiform features.   Alexis Goodell, MD Neurology [phone] 12/03/2019, 1:21 PM

## 2019-12-03 NOTE — ED Notes (Signed)
Dr. Sethi at bedside. 

## 2019-12-03 NOTE — Progress Notes (Signed)
Please see H&P from this morning for full details.  64 year old male with history of hyperlipidemia, GERD presented with transient episode of left facial droop and right foot dragging as noted by his wife while pushing the shopping cart.  Stroke ruled out with CT head/MRI.CTA mild to mod carotid stenosis.  Seen by neurology who recommended adding Plavix to aspirin 81 mg (patient states he was taking aspirin every other day as he tends to have prolonged bleeding time).  Plavix 300 mg x 1 ordered for today-to start 75 mg daily from a.m.  Check PT/INR/bleeding time given patient's concerns.  Follow-up echo and PT evaluation.  Discussed with neurology, Dr. Leonie Man, who also plans on obtaining EEG.  Recommended to hold discharge until cleared by them.  Patient symptoms currently resolved.

## 2019-12-03 NOTE — Progress Notes (Signed)
STROKE TEAM PROGRESS NOTE   INTERVAL HISTORY  Patient was seen multiple times today.  He had previously been loaded with Plavix and received aspirin for his TIA.  When initially seen during rounds his symptoms had completely resolved but wife mentioned that he had had fluctuating course with 2 separate episodes of 10 to 15 minutes duration with left facial droop and left leg weakness in one of them.  On initial evaluation during rounds NIH stroke scale was 0.  MRI scan of the brain had been done which showed no acute infarct.  Patient appeared sleepy and we obtained an EEG which showed no seizure activity.  Patient and family expressed interest in participation in the [facility]MS axiomatiic stroke trial and was given information to review and decide.  The patient subsequently ambulated with physical therapy and after the episode had transient left facial asymmetry and I was called back to evaluate him.  He had only subtle left facial droop.  He was asked to lay flat in bed and given normal saline finely cc bolus following which his symptoms resolved completely.  The patient last neurological assessment at 2 PM showed no neurological deficits.  The patient signed consent to participate in the BMS axiomatiic stroke trial and was subsequently transferred to neurology floor bed on 3 W. and before patient could be randomized he was found to have neurological worsening.  I examined the patient at the bedside and he had left facial droop and subtle left arm drift with NIH stroke scale of 3.  A code stroke was activated and IV TPA was considered and patient taken down for a stat CT scan of the head which I personally reviewed was unremarkable with a aspect score of 10.  CT angiogram of the brain and neck was also repeated which showed bilateral carotid bifurcation plaque with a small ulcerated plaque at the right carotid bifurcation but no large vessel stenosis or occlusion was noted.  TPA was prepared and mixed but by the time  CT angiogram was performed patient recovered back to baseline with NIH stroke scale of 0 hence TPA was not given.  Patient will hence continue participation in the BMS stroke trial I met with patient's wife at the bedside multiple times and kept her updated about evaluation and treatment plan and answered questions. Last seen normal 2 PM 12/03/2019. IV TPA considered but not given due to resolving and minimum deficits Vitals:   12/02/19 2315 12/03/19 0151 12/03/19 0702 12/03/19 0804  BP: (!) 162/78 (!) 166/94 135/66 130/69  Pulse: (!) 46 (!) 52 (!) 44 (!) 44  Resp: 14 13 12 16   Temp:      TempSrc:      SpO2: 98% 98% 99% 98%  Weight:      Height:        CBC:  Recent Labs  Lab 12/02/19 1954 12/02/19 2012  WBC 6.6  --   NEUTROABS 4.9  --   HGB 15.1 14.3  HCT 45.1 42.0  MCV 91.5  --   PLT 251  --     Basic Metabolic Panel:  Recent Labs  Lab 12/02/19 1954 12/02/19 2012  NA 137 138  K 3.8 3.8  CL 104 103  CO2 21*  --   GLUCOSE 141* 152*  BUN 16 18  CREATININE 1.07 1.10  CALCIUM 9.1  --    Lipid Panel:     Component Value Date/Time   CHOL 174 12/03/2019 0316   TRIG 55 12/03/2019 0316  HDL 53 12/03/2019 0316   CHOLHDL 3.3 12/03/2019 0316   VLDL 11 12/03/2019 0316   LDLCALC 110 (H) 12/03/2019 0316   HgbA1c:  Lab Results  Component Value Date   HGBA1C 5.2 12/02/2019   Urine Drug Screen: No results found for: LABOPIA, COCAINSCRNUR, LABBENZ, AMPHETMU, THCU, LABBARB  Alcohol Level No results found for: ETH  IMAGING past 24 hours CT Angio Head W or Wo Contrast  Result Date: 12/03/2019 CLINICAL DATA:  Initial evaluation for TIA, left-sided weakness. EXAM: CT ANGIOGRAPHY HEAD AND NECK TECHNIQUE: Multidetector CT imaging of the head and neck was performed using the standard protocol during bolus administration of intravenous contrast. Multiplanar CT image reconstructions and MIPs were obtained to evaluate the vascular anatomy. Carotid stenosis measurements (when  applicable) are obtained utilizing NASCET criteria, using the distal internal carotid diameter as the denominator. CONTRAST:  30m OMNIPAQUE IOHEXOL 350 MG/ML SOLN COMPARISON:  Comparison made with prior brain MRI and head CT from earlier same day. FINDINGS: CTA NECK FINDINGS Aortic arch: Visualized aortic arch of normal caliber with normal 3 vessel morphology. No hemodynamically significant stenosis or other abnormality seen about the origin of the great vessels. Right carotid system: Scattered eccentric soft plaque seen throughout the mid and distal right CCA with associated mild multifocal stenosis. Bulky calcified noncalcified plaque about the right bifurcation/proximal right ICA with associated stenosis of up to approximately 35-40% by NASCET criteria. Right ICA otherwise widely patent distally to the skull base without stenosis, dissection or occlusion. Left carotid system: Left CCA patent from its origin to the bifurcation without stenosis. Mild calcified plaque about the left bifurcation without hemodynamically significant stenosis. Left ICA widely patent distally without stenosis, dissection or occlusion. Vertebral arteries: Both vertebral arteries arise from the subclavian arteries. No proximal subclavian artery stenosis. Evaluation of the distal V2/V3 segment somewhat limited by adjacent venous contamination, worse on the left. Visualized vertebral arteries widely patent without stenosis, dissection or occlusion. Skeleton: No acute osseous abnormality. No discrete or worrisome osseous lesions. Mild multilevel cervical spondylosis without high-grade stenosis. Other neck: No other acute soft tissue abnormality within the neck. No mass lesion or adenopathy. Upper chest: Visualized upper chest demonstrates no acute finding. Review of the MIP images confirms the above findings CTA HEAD FINDINGS Anterior circulation: Both internal carotid arteries widely patent to the termini without stenosis or other  abnormality. A1 segments widely patent. Normal anterior communicating artery complex. Anterior cerebral arteries widely patent to their distal aspects without stenosis. No M1 stenosis or occlusion. Normal MCA bifurcations. Distal MCA branches well perfused and symmetric. Posterior circulation: Both vertebral arteries widely patent to the vertebrobasilar junction without stenosis. Left vertebral artery slightly dominant. Both picas patent. Basilar patent to its distal aspect without stenosis. Superior cerebral arteries patent bilaterally. Left PCA supplied primarily via the basilar. Right PCA supplied via a the basilar as well as a robust right posterior communicating artery. Both PCAs well perfused to their distal aspects without stenosis. Venous sinuses: Patent. Anatomic variants: None significant.  No intracranial aneurysm. Review of the MIP images confirms the above findings IMPRESSION: 1. Negative CTA for emergent large vessel occlusion. 2. Bulky next plaque about the right carotid bifurcation/proximal right ICA with associated stenosis of up to 35-40% by NASCET criteria. Additional mild multifocal stenoses present throughout the mid and distal right CCA. 3. Additional mild atherosclerotic change about the left carotid bifurcation without significant stenosis. 4. Otherwise wide patency of the major arterial vasculature of the head and neck. No other hemodynamically significant  or correctable stenosis. Electronically Signed   By: Jeannine Boga M.D.   On: 12/03/2019 01:28   CT HEAD WO CONTRAST  Result Date: 12/02/2019 CLINICAL DATA:  Left-sided facial droop and drooling. EXAM: CT HEAD WITHOUT CONTRAST TECHNIQUE: Contiguous axial images were obtained from the base of the skull through the vertex without intravenous contrast. COMPARISON:  None. FINDINGS: Brain: No evidence of acute infarction, hemorrhage, hydrocephalus, extra-axial collection or mass lesion/mass effect. Vascular: No hyperdense vessel or  unexpected calcification. Skull: Normal. Negative for fracture or focal lesion. Sinuses/Orbits: No acute finding. Other: None. IMPRESSION: No acute intracranial process. Electronically Signed   By: Zerita Boers M.D.   On: 12/02/2019 20:26   CT Angio Neck W and/or Wo Contrast  Result Date: 12/03/2019 CLINICAL DATA:  Initial evaluation for TIA, left-sided weakness. EXAM: CT ANGIOGRAPHY HEAD AND NECK TECHNIQUE: Multidetector CT imaging of the head and neck was performed using the standard protocol during bolus administration of intravenous contrast. Multiplanar CT image reconstructions and MIPs were obtained to evaluate the vascular anatomy. Carotid stenosis measurements (when applicable) are obtained utilizing NASCET criteria, using the distal internal carotid diameter as the denominator. CONTRAST:  30m OMNIPAQUE IOHEXOL 350 MG/ML SOLN COMPARISON:  Comparison made with prior brain MRI and head CT from earlier same day. FINDINGS: CTA NECK FINDINGS Aortic arch: Visualized aortic arch of normal caliber with normal 3 vessel morphology. No hemodynamically significant stenosis or other abnormality seen about the origin of the great vessels. Right carotid system: Scattered eccentric soft plaque seen throughout the mid and distal right CCA with associated mild multifocal stenosis. Bulky calcified noncalcified plaque about the right bifurcation/proximal right ICA with associated stenosis of up to approximately 35-40% by NASCET criteria. Right ICA otherwise widely patent distally to the skull base without stenosis, dissection or occlusion. Left carotid system: Left CCA patent from its origin to the bifurcation without stenosis. Mild calcified plaque about the left bifurcation without hemodynamically significant stenosis. Left ICA widely patent distally without stenosis, dissection or occlusion. Vertebral arteries: Both vertebral arteries arise from the subclavian arteries. No proximal subclavian artery stenosis. Evaluation  of the distal V2/V3 segment somewhat limited by adjacent venous contamination, worse on the left. Visualized vertebral arteries widely patent without stenosis, dissection or occlusion. Skeleton: No acute osseous abnormality. No discrete or worrisome osseous lesions. Mild multilevel cervical spondylosis without high-grade stenosis. Other neck: No other acute soft tissue abnormality within the neck. No mass lesion or adenopathy. Upper chest: Visualized upper chest demonstrates no acute finding. Review of the MIP images confirms the above findings CTA HEAD FINDINGS Anterior circulation: Both internal carotid arteries widely patent to the termini without stenosis or other abnormality. A1 segments widely patent. Normal anterior communicating artery complex. Anterior cerebral arteries widely patent to their distal aspects without stenosis. No M1 stenosis or occlusion. Normal MCA bifurcations. Distal MCA branches well perfused and symmetric. Posterior circulation: Both vertebral arteries widely patent to the vertebrobasilar junction without stenosis. Left vertebral artery slightly dominant. Both picas patent. Basilar patent to its distal aspect without stenosis. Superior cerebral arteries patent bilaterally. Left PCA supplied primarily via the basilar. Right PCA supplied via a the basilar as well as a robust right posterior communicating artery. Both PCAs well perfused to their distal aspects without stenosis. Venous sinuses: Patent. Anatomic variants: None significant.  No intracranial aneurysm. Review of the MIP images confirms the above findings IMPRESSION: 1. Negative CTA for emergent large vessel occlusion. 2. Bulky next plaque about the right carotid bifurcation/proximal right ICA with  associated stenosis of up to 35-40% by NASCET criteria. Additional mild multifocal stenoses present throughout the mid and distal right CCA. 3. Additional mild atherosclerotic change about the left carotid bifurcation without  significant stenosis. 4. Otherwise wide patency of the major arterial vasculature of the head and neck. No other hemodynamically significant or correctable stenosis. Electronically Signed   By: Jeannine Boga M.D.   On: 12/03/2019 01:28   MR BRAIN WO CONTRAST  Result Date: 12/03/2019 CLINICAL DATA:  Initial evaluation for acute TIA, left facial droop. EXAM: MRI HEAD WITHOUT CONTRAST TECHNIQUE: Multiplanar, multiecho pulse sequences of the brain and surrounding structures were obtained without intravenous contrast. COMPARISON:  Prior head CT from earlier the same day. FINDINGS: Brain: Cerebral volume within normal limits for patient age. No significant cerebral white matter disease for age. Tiny remote lacunar infarct noted within the left thalamus. Additional small remote lacunar infarct noted at the left lentiform nucleus. No abnormal foci of restricted diffusion to suggest acute or subacute ischemia. Gray-white matter differentiation well maintained. No encephalomalacia to suggest chronic infarction. No foci of susceptibility artifact to suggest acute or chronic intracranial hemorrhage. No mass lesion, midline shift or mass effect. No hydrocephalus. No extra-axial fluid collection. Major dural sinuses are grossly patent. Pituitary gland and suprasellar region are normal. Midline structures intact and normal. Vascular: Major intracranial vascular flow voids well maintained and normal in appearance. Skull and upper cervical spine: Craniocervical junction normal. Visualized upper cervical spine within normal limits. Bone marrow signal intensity normal. No scalp soft tissue abnormality. Sinuses/Orbits: Globes and orbital soft tissues within normal limits. Paranasal sinuses are clear. No mastoid effusion. Inner ear structures normal. Other: None. IMPRESSION: 1. No acute intracranial abnormality. 2. 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 3. Otherwise normal brain MRI for  age. Electronically Signed   By: Jeannine Boga M.D.   On: 12/03/2019 01:07    PHYSICAL EXAM Mildly obese middle-aged Caucasian male not in distress. . Afebrile. Head is nontraumatic. Neck is supple without bruit.    Cardiac exam no murmur or gallop. Lungs are clear to auscultation. Distal pulses are well felt. Neurological Exam ;  Awake  Alert oriented x 3. Normal speech and language.eye movements full without nystagmus.fundi were not visualized. Vision acuity and fields appear normal. Hearing is normal. Palatal movements are normal. Face symmetric. Tongue midline. Normal strength, tone, reflexes and coordination. Normal sensation. Gait deferred. NIH stroke scale 0 Premorbid modified Rankin scale 0 ASSESSMENT/PLAN Mr. Andrew Mendoza is a 64 y.o. male with history of HLD presenting with transient L facial droop and difficulty moving his leg and nonsensical slurred speech x 2.   R brain TIA - Lethargy out of proportion to TIA dx  CT head No acute abnormality.      MRI  No acute abnormality. Old L lentiform nucleus and L thalamic lacunes.  CTA head & neck no LVO. R ICA plaque 35-40% w/ mild mid and distal R CCA atherosclerosis. L ICA bifurcation atherosclerosis.   2D Echo pending   EEG pending   LDL 110  HgbA1c 5.2  Lovenox 40 mg sq daily for VTE prophylaxis  aspirin 81 mg every other day prior to admission, now on aspirin 81 mg daily and clopidogrel 75 mg daily following plavix load. Continue DAPT x 3 weeks then aspirin alone.   Therapy recommendations:  pending   Disposition:  pending   No Hx Hypertension  BP as high as 170/83  Monitor BP .  Permissive hypertension (OK if < 220/120) but gradually normalize in 5-7 days . Long-term BP goal normotensive  Hyperlipidemia  Home meds:  zocor 20  Now on lipitor 80   LDL 110, goal < 70  Continue statin at discharge  Other Stroke Risk Factors  Cigarette smoker, advised to stop smoking  ETOH use,  advised to drink  no more than 2 drink(s) a day  Overweight, Body mass index is 29.19 kg/m., recommend weight loss, diet and exercise as appropriate   Family hx stroke (father)  Other Active Problems  Hx throat cancer  GERD on PPI   Hospital day # 0 He presented with transient facial droop and left leg weakness likely right brain subcortical TIA.  He has had a fluctuating course.  Will admit for further stroke work-up.  He will need continue close neurological monitoring and if he has recurrent deficits which are persistent and disabling may consider TPA if is still within the window.  Check echocardiogram, lipid profile hemoglobin A1c.  Patient may consider participation in [place] stroke trial.  He and his wife were given written information to review and they have decided to participate.  He has been randomized into the trial. This patient is critically ill and at significant risk of neurological worsening, death and care requires constant monitoring of vital signs, hemodynamics,respiratory and cardiac monitoring, extensive review of multiple databases, frequent neurological assessment, discussion with family, other specialists and medical decision making of high complexity.I have made any additions or clarifications directly to the above note.This critical care time does not reflect procedure time, or teaching time or supervisory time of PA/NP/Med Resident etc but could involve care discussion time.  I spent 60 minutes of neurocritical care time  in the care of  this patient.   Antony Contras, MD  To contact Stroke Continuity provider, please refer to [email or web address] After hours, contact General Neurology

## 2019-12-03 NOTE — Progress Notes (Signed)
This Probation officer notified MD of pt change in condition. Pt wife noticed pt starting to mumble and slur his words. Left side of pt face had a slight droop. MD at pt bedside. MD noted that pt had a drift in his left arm, which was not on previous assessments. Pt words slurring and facial droop noted. MD ordered to call a code stroke. Code stoke initiated and pt taken down for CT. CT complete and pt reassessed with a NIH of 0 noted.

## 2019-12-03 NOTE — ED Notes (Signed)
After completing modified NIH, in which the score was 0; the Pt yawned and it looked liked the left lower corner of his mouth slightly drooped but rapidly returned to normal. Pt's wife at bedside observed the same thing and stated that was the same droop she noticed earlier in the day. I rechecked Pt and he was A&O x4 with no problems in any extremities. Pt stated that he felt fine and vitals WDL.

## 2019-12-03 NOTE — ED Notes (Signed)
DR Rosanne Ashing at bed side to eval Pt.

## 2019-12-03 NOTE — Progress Notes (Signed)
New Admission Note:  Arrival Method: arrived via stretcher from  [facility] ED Mental Orientation: pt alert and oriented Telemetry: yes with Sinus Loletha Grayer noted on the monitor. MD made aware Assessment: Completed Skin: clean and dry IV: 20G in left AC Pain:pt has no complaints of pain at this time. Safety Measures: Safety Fall Prevention Plan was given, discussed. Admission: Completed 3W: Patient has been orientated to the room, unit and the staff. Family: pt was at bedside. Appears to be anxious and worried about her husband, but pleasant and appropriate for the situation.  Orders have been reviewed and implemented. Will continue to monitor the patient. Call light has been placed within reach and bed alarm has been activated.   Janus Molder ,RN

## 2019-12-03 NOTE — Code Documentation (Signed)
Stroke Response Nurse Documentation Code Documentation  Andrew Mendoza is a 64 y.o. male, patient admitted 3W22 for TIA with past medical hx of HLD & cancer. Code stroke called by nurse at 1616 for n/o symptoms of L facial droop, dysarthria, & L weakness. Patient was LKW at 1500 in ED and at 1550 on arrival to unit 3W nurse states that wife noted that patient was not at his baseline, following assessment nurse activated code stroke. On aspirin 81 mg daily and clopidogrel 75 mg daily. Dr. Leonie Man at bedside on stroke team arrival at 1620 NIHSS 3 on exam at bedside, see documentation for details and code stroke times. SRN transported patient to CT with RRT. The following imaging was completed:  CT Head, CTA head and neck. tPA prepared per Dr. Leonie Man, but by the time tPA to bedside symptoms had resolved. Patient is not a candidate for tPA at this point due to resolution of symptoms per Dr. Leonie Man. Care/Plan: q2h neuro checks. Bedside handoff with 3W RN Levada Dy.    [place]  Stroke Response RN

## 2019-12-03 NOTE — H&P (Signed)
History and Physical    Andrew Mendoza OJJ:[id number] DOB: Jul 14, 1955 DOA: 12/02/2019  PCP: [place] Pink, MD (Confirm with patient/family/NH records and if not entered, this has to be entered at [facility] point of entry) Patient coming from: home  I have personally briefly reviewed patient's old medical records in [place]  Chief Complaint: transient facial droop and left leg weakness  HPI: Andrew Mendoza is a 64 y.o. male with medical history significant of GERD, HLD who was in his usual state of good health. Today about 11AM his wife noted him to have a mild left facial droop and noticed that he had an abnormal gait pushing his left leg forward. Around 6PM he had a second similar episode. Each episode lasted less than 10 minutes. Due his symptoms he presents to [facility] for [facility].   ED Course: Afebrile, BP 162/78. Patient was asymptomatic on presentation to ED. Lab results notable for glucose of 152 otherwise normal. Head CT normal. ED-P spoke with Dr. Lorraine Lax, neurology who recommend routine diagnostic testing and have medicine admit. [facility] called to admit patient  Review of Systems: As per HPI otherwise 10 point review of systems negative.    Past Medical History:  Diagnosis Date  . GERD (gastroesophageal reflux disease)   . History of chicken pox   . Hypercholesteremia   . Throat cancer [facility])     Past Surgical History:  Procedure Laterality Date  . COLONOSCOPY WITH PROPOFOL    . COLONOSCOPY WITH PROPOFOL N/A 05/13/2019   Procedure: COLONOSCOPY WITH PROPOFOL;  Surgeon: Toledo, Benay Pike, MD;  Location: [facility] ENDOSCOPY;  Service: Gastroenterology;  Laterality: N/A;  . 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 I-ADLs    reports that he has quit smoking. His smoking use included cigarettes. He has a 10.00 pack-year smoking history. He has never used smokeless tobacco. He reports current alcohol use. He reports that he does not use drugs.  No Known Allergies  Family History  Problem Relation Age of Onset  . Breast cancer Mother   . Stroke Father   . Hypertension Father   . Heart attack Brother 88     Prior to Admission medications   Medication Sig Start Date End Date Taking? Authorizing Provider  aspirin 81 MG tablet Take 81 mg by mouth every other day.    Yes [provider]  Cholecalciferol (VITAMIN D3) 1.25 MG (50000 UT) CAPS Take 1 capsule by mouth daily.   Yes [provider]  Multiple Vitamin (MULTIVITAMIN) tablet Take 1 tablet by mouth daily.   Yes [provider]  omeprazole (PRILOSEC) 40 MG capsule Take 40 mg by mouth daily.  02/12/13  Yes [provider]  simvastatin (ZOCOR) 20 MG tablet Take 20 mg by mouth daily. 11/10/19  Yes [provider]  vitamin B-12 (CYANOCOBALAMIN) 1000 MCG tablet Take 1,000 mcg by mouth daily.   Yes [provider]  vitamin E (VITAMIN E) 400 UNIT capsule Take 400 Units by mouth daily.    Yes [provider]    Physical Exam: Vitals:   12/02/19 2116 12/02/19 2148 12/02/19 2215 12/02/19 2315  BP: (!) 150/87 (!) 145/79 139/81 (!) 162/78  Pulse: (!) 48 (!) 50 (!) 49 (!) 46  Resp: 18 18 17 14   Temp: 98.1 F (  36.7 C) 98.2 F (36.8 C)    TempSrc: Oral Oral    SpO2: 100% 100% 98% 98%  Weight: 87.1 kg     Height: 5\' 8"  (1.727 m)       Constitutional: NAD, calm, comfortable Vitals:   12/02/19 2116 12/02/19 2148 12/02/19 2215 12/02/19 2315  BP: (!) 150/87 (!) 145/79 139/81 (!) 162/78  Pulse: (!) 48 (!) 50 (!) 49 (!) 46  Resp: 18 18 17 14   Temp: 98.1 F (36.7 C) 98.2 F (36.8 C)    TempSrc: Oral Oral    SpO2: 100% 100% 98% 98%  Weight: 87.1 kg     Height: 5\' 8"  (1.727 m)      General:  Solidly  built man in no distress Eyes: PERRL, lids and conjunctivae normal, normal visual acuity ENMT: Mucous membranes are moist. Posterior pharynx clear of any exudate or lesions.Normal dentition.  Neck: normal, supple, no masses, no thyromegaly Respiratory: clear to auscultation bilaterally, no wheezing, no crackles. Normal respiratory effort. No accessory muscle use.  Cardiovascular: Regular rate and rhythm, no murmurs / rubs / gallops. No extremity edema. 2+ pedal pulses. No carotid bruits.  Abdomen: no tenderness, no masses palpated. No hepatosplenomegaly. Bowel sounds positive.  Musculoskeletal: no clubbing / cyanosis. No joint deformity upper and lower extremities. Good ROM, no contractures. Normal muscle tone.  Skin: no rashes, lesions, ulcers. No induration Neurologic: CN 2-12 - normal facial symmetry and movement, normal brow movement, normal shoulder shrug. MS. 5/5 bilateral grips UE strength, LE strength. DTRs 2+ symmetrical biceps, patellar, achilles tendons. Sensation in tact to light touch. Nl rapid finger movement, no dysdiadochokinesia.Marland Kitchen  Psychiatric: Normal judgment and insight. Alert and oriented x 3. Normal mood.     Labs on Admission: I have personally reviewed following labs and imaging studies  CBC: Recent Labs  Lab 12/02/19 1954 12/02/19 2012  WBC 6.6  --   NEUTROABS 4.9  --   HGB 15.1 14.3  HCT 45.1 42.0  MCV 91.5  --   PLT 251  --    Basic Metabolic Panel: Recent Labs  Lab 12/02/19 1954 12/02/19 2012  NA 137 138  K 3.8 3.8  CL 104 103  CO2 21*  --   GLUCOSE 141* 152*  BUN 16 18  CREATININE 1.07 1.10  CALCIUM 9.1  --    GFR: Estimated Creatinine Clearance: 73.8 mL/min (by C-G formula based on SCr of 1.1 mg/dL). Liver Function Tests: Recent Labs  Lab 12/02/19 1954  AST 23  ALT 28  ALKPHOS 70  BILITOT 1.0  PROT 7.1  ALBUMIN 4.3   No results for input(s): LIPASE, AMYLASE in the last 168 hours. No results for input(s): AMMONIA in the last 168  hours. Coagulation Profile: Recent Labs  Lab 12/02/19 1954  INR 1.0   Cardiac Enzymes: No results for input(s): CKTOTAL, CKMB, CKMBINDEX, TROPONINI in the last 168 hours. BNP (last 3 results) No results for input(s): PROBNP in the last 8760 hours. HbA1C: No results for input(s): HGBA1C in the last 72 hours. CBG: No results for input(s): GLUCAP in the last 168 hours. Lipid Profile: No results for input(s): CHOL, HDL, LDLCALC, TRIG, CHOLHDL, LDLDIRECT in the last 72 hours. Thyroid Function Tests: No results for input(s): TSH, T4TOTAL, FREET4, T3FREE, THYROIDAB in the last 72 hours. Anemia Panel: No results for input(s): VITAMINB12, FOLATE, FERRITIN, TIBC, IRON, RETICCTPCT in the last 72 hours. Urine analysis: No results found for: COLORURINE, APPEARANCEUR, LABSPEC, [place], GLUCOSEU, HGBUR, BILIRUBINUR, KETONESUR, PROTEINUR, UROBILINOGEN, NITRITE,  LEUKOCYTESUR  Radiological Exams on Admission: CT Angio Head W or Wo Contrast  Result Date: 12/03/2019 CLINICAL DATA:  Initial evaluation for TIA, left-sided weakness. EXAM: CT ANGIOGRAPHY HEAD AND NECK TECHNIQUE: Multidetector CT imaging of the head and neck was performed using the standard protocol during bolus administration of intravenous contrast. Multiplanar CT image reconstructions and MIPs were obtained to evaluate the vascular anatomy. Carotid stenosis measurements (when applicable) are obtained utilizing NASCET criteria, using the distal internal carotid diameter as the denominator. CONTRAST:  4mL OMNIPAQUE IOHEXOL 350 MG/ML SOLN COMPARISON:  Comparison made with prior brain MRI and head CT from earlier same day. FINDINGS: CTA NECK FINDINGS Aortic arch: Visualized aortic arch of normal caliber with normal 3 vessel morphology. No hemodynamically significant stenosis or other abnormality seen about the origin of the great vessels. Right carotid system: Scattered eccentric soft plaque seen throughout the mid and distal right CCA with  associated mild multifocal stenosis. Bulky calcified noncalcified plaque about the right bifurcation/proximal right ICA with associated stenosis of up to approximately 35-40% by NASCET criteria. Right ICA otherwise widely patent distally to the skull base without stenosis, dissection or occlusion. Left carotid system: Left CCA patent from its origin to the bifurcation without stenosis. Mild calcified plaque about the left bifurcation without hemodynamically significant stenosis. Left ICA widely patent distally without stenosis, dissection or occlusion. Vertebral arteries: Both vertebral arteries arise from the subclavian arteries. No proximal subclavian artery stenosis. Evaluation of the distal V2/V3 segment somewhat limited by adjacent venous contamination, worse on the left. Visualized vertebral arteries widely patent without stenosis, dissection or occlusion. Skeleton: No acute osseous abnormality. No discrete or worrisome osseous lesions. Mild multilevel cervical spondylosis without high-grade stenosis. Other neck: No other acute soft tissue abnormality within the neck. No mass lesion or adenopathy. Upper chest: Visualized upper chest demonstrates no acute finding. Review of the MIP images confirms the above findings CTA HEAD FINDINGS Anterior circulation: Both internal carotid arteries widely patent to the termini without stenosis or other abnormality. A1 segments widely patent. Normal anterior communicating artery complex. Anterior cerebral arteries widely patent to their distal aspects without stenosis. No M1 stenosis or occlusion. Normal MCA bifurcations. Distal MCA branches well perfused and symmetric. Posterior circulation: Both vertebral arteries widely patent to the vertebrobasilar junction without stenosis. Left vertebral artery slightly dominant. Both picas patent. Basilar patent to its distal aspect without stenosis. Superior cerebral arteries patent bilaterally. Left PCA supplied primarily via the  basilar. Right PCA supplied via a the basilar as well as a robust right posterior communicating artery. Both PCAs well perfused to their distal aspects without stenosis. Venous sinuses: Patent. Anatomic variants: None significant.  No intracranial aneurysm. Review of the MIP images confirms the above findings IMPRESSION: 1. Negative CTA for emergent large vessel occlusion. 2. Bulky next plaque about the right carotid bifurcation/proximal right ICA with associated stenosis of up to 35-40% by NASCET criteria. Additional mild multifocal stenoses present throughout the mid and distal right CCA. 3. Additional mild atherosclerotic change about the left carotid bifurcation without significant stenosis. 4. Otherwise wide patency of the major arterial vasculature of the head and neck. No other hemodynamically significant or correctable stenosis. Electronically Signed   By: Jeannine Boga M.D.   On: 12/03/2019 01:28   CT HEAD WO CONTRAST  Result Date: 12/02/2019 CLINICAL DATA:  Left-sided facial droop and drooling. EXAM: CT HEAD WITHOUT CONTRAST TECHNIQUE: Contiguous axial images were obtained from the base of the skull through the vertex without intravenous contrast. COMPARISON:  None. FINDINGS: Brain: No evidence of acute infarction, hemorrhage, hydrocephalus, extra-axial collection or mass lesion/mass effect. Vascular: No hyperdense vessel or unexpected calcification. Skull: Normal. Negative for fracture or focal lesion. Sinuses/Orbits: No acute finding. Other: None. IMPRESSION: No acute intracranial process. Electronically Signed   By: Zerita Boers M.D.   On: 12/02/2019 20:26   CT Angio Neck W and/or Wo Contrast  Result Date: 12/03/2019 CLINICAL DATA:  Initial evaluation for TIA, left-sided weakness. EXAM: CT ANGIOGRAPHY HEAD AND NECK TECHNIQUE: Multidetector CT imaging of the head and neck was performed using the standard protocol during bolus administration of intravenous contrast. Multiplanar CT image  reconstructions and MIPs were obtained to evaluate the vascular anatomy. Carotid stenosis measurements (when applicable) are obtained utilizing NASCET criteria, using the distal internal carotid diameter as the denominator. CONTRAST:  8mL OMNIPAQUE IOHEXOL 350 MG/ML SOLN COMPARISON:  Comparison made with prior brain MRI and head CT from earlier same day. FINDINGS: CTA NECK FINDINGS Aortic arch: Visualized aortic arch of normal caliber with normal 3 vessel morphology. No hemodynamically significant stenosis or other abnormality seen about the origin of the great vessels. Right carotid system: Scattered eccentric soft plaque seen throughout the mid and distal right CCA with associated mild multifocal stenosis. Bulky calcified noncalcified plaque about the right bifurcation/proximal right ICA with associated stenosis of up to approximately 35-40% by NASCET criteria. Right ICA otherwise widely patent distally to the skull base without stenosis, dissection or occlusion. Left carotid system: Left CCA patent from its origin to the bifurcation without stenosis. Mild calcified plaque about the left bifurcation without hemodynamically significant stenosis. Left ICA widely patent distally without stenosis, dissection or occlusion. Vertebral arteries: Both vertebral arteries arise from the subclavian arteries. No proximal subclavian artery stenosis. Evaluation of the distal V2/V3 segment somewhat limited by adjacent venous contamination, worse on the left. Visualized vertebral arteries widely patent without stenosis, dissection or occlusion. Skeleton: No acute osseous abnormality. No discrete or worrisome osseous lesions. Mild multilevel cervical spondylosis without high-grade stenosis. Other neck: No other acute soft tissue abnormality within the neck. No mass lesion or adenopathy. Upper chest: Visualized upper chest demonstrates no acute finding. Review of the MIP images confirms the above findings CTA HEAD FINDINGS Anterior  circulation: Both internal carotid arteries widely patent to the termini without stenosis or other abnormality. A1 segments widely patent. Normal anterior communicating artery complex. Anterior cerebral arteries widely patent to their distal aspects without stenosis. No M1 stenosis or occlusion. Normal MCA bifurcations. Distal MCA branches well perfused and symmetric. Posterior circulation: Both vertebral arteries widely patent to the vertebrobasilar junction without stenosis. Left vertebral artery slightly dominant. Both picas patent. Basilar patent to its distal aspect without stenosis. Superior cerebral arteries patent bilaterally. Left PCA supplied primarily via the basilar. Right PCA supplied via a the basilar as well as a robust right posterior communicating artery. Both PCAs well perfused to their distal aspects without stenosis. Venous sinuses: Patent. Anatomic variants: None significant.  No intracranial aneurysm. Review of the MIP images confirms the above findings IMPRESSION: 1. Negative CTA for emergent large vessel occlusion. 2. Bulky next plaque about the right carotid bifurcation/proximal right ICA with associated stenosis of up to 35-40% by NASCET criteria. Additional mild multifocal stenoses present throughout the mid and distal right CCA. 3. Additional mild atherosclerotic change about the left carotid bifurcation without significant stenosis. 4. Otherwise wide patency of the major arterial vasculature of the head and neck. No other hemodynamically significant or correctable stenosis. Electronically Signed   By:  Jeannine Boga M.D.   On: 12/03/2019 01:28   MR BRAIN WO CONTRAST  Result Date: 12/03/2019 CLINICAL DATA:  Initial evaluation for acute TIA, left facial droop. EXAM: MRI HEAD WITHOUT CONTRAST TECHNIQUE: Multiplanar, multiecho pulse sequences of the brain and surrounding structures were obtained without intravenous contrast. COMPARISON:  Prior head CT from earlier the same day.  FINDINGS: Brain: Cerebral volume within normal limits for patient age. No significant cerebral white matter disease for age. Tiny remote lacunar infarct noted within the left thalamus. Additional small remote lacunar infarct noted at the left lentiform nucleus. No abnormal foci of restricted diffusion to suggest acute or subacute ischemia. Gray-white matter differentiation well maintained. No encephalomalacia to suggest chronic infarction. No foci of susceptibility artifact to suggest acute or chronic intracranial hemorrhage. No mass lesion, midline shift or mass effect. No hydrocephalus. No extra-axial fluid collection. Major dural sinuses are grossly patent. Pituitary gland and suprasellar region are normal. Midline structures intact and normal. Vascular: Major intracranial vascular flow voids well maintained and normal in appearance. Skull and upper cervical spine: Craniocervical junction normal. Visualized upper cervical spine within normal limits. Bone marrow signal intensity normal. No scalp soft tissue abnormality. Sinuses/Orbits: Globes and orbital soft tissues within normal limits. Paranasal sinuses are clear. No mastoid effusion. Inner ear structures normal. Other: None. IMPRESSION: 1. No acute intracranial abnormality. 2. 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 3. Otherwise normal brain MRI for age. Electronically Signed   By: Jeannine Boga M.D.   On: 12/03/2019 01:07    EKG: Independently reviewed. Sinus bradycardia at 52, incomplete  Bundle branch block. No acute change  Assessment/Plan Active Problems:   TIA (transient ischemic attack)   GERD (gastroesophageal reflux disease)    1. TIA - patient with transient symptoms resolved quickly. CT head negative. MRI revealed small old lacunar infarcts left lentiform nucleus , left thalamus otherwise normal. CTA head/neck pending. Plan Tele observation  2D echo    Increase ASA to 325 mg daily  Check  A1C, lipid panel  Continue home antilipemic  PT   neurology to see  2. HLD Continue home meds  Lipid panel  3. HTN - normotensive at home by report Plan Monitor BP if consistently runnng SBP > 140 will add antihypertensive medication  4. GERD - conintue PPI   DVT prophylaxis: lovenox  Code Status: full code (Full/Partial (specify details) Family Communication: wife present during interview and exam. Answered all questions Disposition Plan: home 24 -48 hours  Consults called: Neurology  - Dr. Lorraine Lax - called by EDP  Admission status: obs/tele    Adella Hare MD [facility] Pager [phone]  If 7PM-7AM, please contact night-coverage [email or web address] Password [email or web address]  12/03/2019, 1:44 AM

## 2019-12-03 NOTE — Consult Note (Signed)
Requesting Physician: Dr. Linda Hedges    Chief Complaint: Left-sided weakness and transient facial droop  History obtained from: Patient and Chart    HPI:                                                                                                                                       Andrew Mendoza is a 64 y.o. male with past medical history significant for hyperlipidemia presents to the emergency department after wife noticed him left facial droop and difficulty moving his leg.  He had another episode around 6 PM lasting for approximately 15 minutes and decided to present to the emergency department.  CT head was unremarkable.  Patient's blood pressure was elevated 510 systolic.Patient also underwent MRI brain which showed no acute findings, there were remote lacunar infarcts. Patient was admitted to hospital service for further evaluation for TIA neurology was consulted.    Date last known well: 7.521 Time last known well: 6 PM tPA Given: No, outside TPA window NIHSS: 0 Baseline MRS 0    Past Medical History:  Diagnosis Date   GERD (gastroesophageal reflux disease)    History of chicken pox    Hypercholesteremia    Throat cancer [facility])     Past Surgical History:  Procedure Laterality Date   COLONOSCOPY WITH PROPOFOL     COLONOSCOPY WITH PROPOFOL N/A 05/13/2019   Procedure: COLONOSCOPY WITH PROPOFOL;  Surgeon: Toledo, Benay Pike, MD;  Location: [facility] ENDOSCOPY;  Service: Gastroenterology;  Laterality: N/A;   ESOPHAGOGASTRODUODENOSCOPY (EGD) WITH PROPOFOL N/A 05/13/2019   Procedure: ESOPHAGOGASTRODUODENOSCOPY (EGD) WITH PROPOFOL;  Surgeon: Toledo, Benay Pike, MD;  Location: [facility] ENDOSCOPY;  Service: Gastroenterology;  Laterality: N/A;    Family History  Problem Relation Age of Onset   Breast cancer Mother    Stroke Father    Hypertension Father    Heart attack Brother 66   Social History:  reports that he has quit smoking. His smoking use included cigarettes. He has a 10.00  pack-year smoking history. He has never used smokeless tobacco. He reports current alcohol use. He reports that he does not use drugs.  Allergies: No Known Allergies  Medications:                                                                                                                        I reviewed home medications   ROS:  14 systems reviewed and negative except above    Examination:                                                                                                      General: Appears well-developed  Psych: Affect appropriate to situation Eyes: No scleral injection HENT: No OP obstrucion Head: Normocephalic.  Cardiovascular: Normal rate and regular rhythm.  Respiratory: Effort normal and breath sounds normal to anterior ascultation GI: Soft.  No distension. There is no tenderness.  Skin: WDI    Neurological Examination Mental Status: Alert, oriented, thought content appropriate.  Speech fluent without evidence of aphasia. Able to follow 3 step commands without difficulty. Cranial Nerves: II: Visual fields grossly normal,  III,IV, VI: ptosis not present, extra-ocular motions intact bilaterally, pupils equal, round, reactive to light and accommodation V,VII: smile symmetric, facial light touch sensation normal bilaterally VIII: hearing normal bilaterally IX,X: uvula rises symmetrically XI: bilateral shoulder shrug XII: midline tongue extension Motor: Right : Upper extremity   5/5    Left:     Upper extremity   5/5  Lower extremity   5/5     Lower extremity   5/5 Tone and bulk:normal tone throughout; no atrophy noted Sensory: Pinprick and light touch intact throughout, bilaterally Deep Tendon Reflexes: 2+ and symmetric throughout Plantars: Right: downgoing   Left: downgoing Cerebellar: normal finger-to-nose, normal rapid  alternating movements and normal heel-to-shin test Gait: normal gait and station     Lab Results: Basic Metabolic Panel: Recent Labs  Lab 12/02/19 1954 12/02/19 2012  NA 137 138  K 3.8 3.8  CL 104 103  CO2 21*  --   GLUCOSE 141* 152*  BUN 16 18  CREATININE 1.07 1.10  CALCIUM 9.1  --     CBC: Recent Labs  Lab 12/02/19 1954 12/02/19 2012  WBC 6.6  --   NEUTROABS 4.9  --   HGB 15.1 14.3  HCT 45.1 42.0  MCV 91.5  --   PLT 251  --     Coagulation Studies: Recent Labs    12/02/19 1954  LABPROT 13.1  INR 1.0    Imaging: CT Angio Head W or Wo Contrast  Result Date: 12/03/2019 CLINICAL DATA:  Initial evaluation for TIA, left-sided weakness. EXAM: CT ANGIOGRAPHY HEAD AND NECK TECHNIQUE: Multidetector CT imaging of the head and neck was performed using the standard protocol during bolus administration of intravenous contrast. Multiplanar CT image reconstructions and MIPs were obtained to evaluate the vascular anatomy. Carotid stenosis measurements (when applicable) are obtained utilizing NASCET criteria, using the distal internal carotid diameter as the denominator. CONTRAST:  77mL OMNIPAQUE IOHEXOL 350 MG/ML SOLN COMPARISON:  Comparison made with prior brain MRI and head CT from earlier same day. FINDINGS: CTA NECK FINDINGS Aortic arch: Visualized aortic arch of normal caliber with normal 3 vessel morphology. No hemodynamically significant stenosis or other abnormality seen about the origin of the great vessels. Right carotid system: Scattered eccentric soft plaque seen throughout the mid and distal right CCA with associated mild multifocal stenosis. Bulky calcified noncalcified plaque about the  right bifurcation/proximal right ICA with associated stenosis of up to approximately 35-40% by NASCET criteria. Right ICA otherwise widely patent distally to the skull base without stenosis, dissection or occlusion. Left carotid system: Left CCA patent from its origin to the bifurcation  without stenosis. Mild calcified plaque about the left bifurcation without hemodynamically significant stenosis. Left ICA widely patent distally without stenosis, dissection or occlusion. Vertebral arteries: Both vertebral arteries arise from the subclavian arteries. No proximal subclavian artery stenosis. Evaluation of the distal V2/V3 segment somewhat limited by adjacent venous contamination, worse on the left. Visualized vertebral arteries widely patent without stenosis, dissection or occlusion. Skeleton: No acute osseous abnormality. No discrete or worrisome osseous lesions. Mild multilevel cervical spondylosis without high-grade stenosis. Other neck: No other acute soft tissue abnormality within the neck. No mass lesion or adenopathy. Upper chest: Visualized upper chest demonstrates no acute finding. Review of the MIP images confirms the above findings CTA HEAD FINDINGS Anterior circulation: Both internal carotid arteries widely patent to the termini without stenosis or other abnormality. A1 segments widely patent. Normal anterior communicating artery complex. Anterior cerebral arteries widely patent to their distal aspects without stenosis. No M1 stenosis or occlusion. Normal MCA bifurcations. Distal MCA branches well perfused and symmetric. Posterior circulation: Both vertebral arteries widely patent to the vertebrobasilar junction without stenosis. Left vertebral artery slightly dominant. Both picas patent. Basilar patent to its distal aspect without stenosis. Superior cerebral arteries patent bilaterally. Left PCA supplied primarily via the basilar. Right PCA supplied via a the basilar as well as a robust right posterior communicating artery. Both PCAs well perfused to their distal aspects without stenosis. Venous sinuses: Patent. Anatomic variants: None significant.  No intracranial aneurysm. Review of the MIP images confirms the above findings IMPRESSION: 1. Negative CTA for emergent large vessel  occlusion. 2. Bulky next plaque about the right carotid bifurcation/proximal right ICA with associated stenosis of up to 35-40% by NASCET criteria. Additional mild multifocal stenoses present throughout the mid and distal right CCA. 3. Additional mild atherosclerotic change about the left carotid bifurcation without significant stenosis. 4. Otherwise wide patency of the major arterial vasculature of the head and neck. No other hemodynamically significant or correctable stenosis. Electronically Signed   By: Jeannine Boga M.D.   On: 12/03/2019 01:28   CT HEAD WO CONTRAST  Result Date: 12/02/2019 CLINICAL DATA:  Left-sided facial droop and drooling. EXAM: CT HEAD WITHOUT CONTRAST TECHNIQUE: Contiguous axial images were obtained from the base of the skull through the vertex without intravenous contrast. COMPARISON:  None. FINDINGS: Brain: No evidence of acute infarction, hemorrhage, hydrocephalus, extra-axial collection or mass lesion/mass effect. Vascular: No hyperdense vessel or unexpected calcification. Skull: Normal. Negative for fracture or focal lesion. Sinuses/Orbits: No acute finding. Other: None. IMPRESSION: No acute intracranial process. Electronically Signed   By: Zerita Boers M.D.   On: 12/02/2019 20:26   CT Angio Neck W and/or Wo Contrast  Result Date: 12/03/2019 CLINICAL DATA:  Initial evaluation for TIA, left-sided weakness. EXAM: CT ANGIOGRAPHY HEAD AND NECK TECHNIQUE: Multidetector CT imaging of the head and neck was performed using the standard protocol during bolus administration of intravenous contrast. Multiplanar CT image reconstructions and MIPs were obtained to evaluate the vascular anatomy. Carotid stenosis measurements (when applicable) are obtained utilizing NASCET criteria, using the distal internal carotid diameter as the denominator. CONTRAST:  74mL OMNIPAQUE IOHEXOL 350 MG/ML SOLN COMPARISON:  Comparison made with prior brain MRI and head CT from earlier same day. FINDINGS:  CTA NECK FINDINGS Aortic  arch: Visualized aortic arch of normal caliber with normal 3 vessel morphology. No hemodynamically significant stenosis or other abnormality seen about the origin of the great vessels. Right carotid system: Scattered eccentric soft plaque seen throughout the mid and distal right CCA with associated mild multifocal stenosis. Bulky calcified noncalcified plaque about the right bifurcation/proximal right ICA with associated stenosis of up to approximately 35-40% by NASCET criteria. Right ICA otherwise widely patent distally to the skull base without stenosis, dissection or occlusion. Left carotid system: Left CCA patent from its origin to the bifurcation without stenosis. Mild calcified plaque about the left bifurcation without hemodynamically significant stenosis. Left ICA widely patent distally without stenosis, dissection or occlusion. Vertebral arteries: Both vertebral arteries arise from the subclavian arteries. No proximal subclavian artery stenosis. Evaluation of the distal V2/V3 segment somewhat limited by adjacent venous contamination, worse on the left. Visualized vertebral arteries widely patent without stenosis, dissection or occlusion. Skeleton: No acute osseous abnormality. No discrete or worrisome osseous lesions. Mild multilevel cervical spondylosis without high-grade stenosis. Other neck: No other acute soft tissue abnormality within the neck. No mass lesion or adenopathy. Upper chest: Visualized upper chest demonstrates no acute finding. Review of the MIP images confirms the above findings CTA HEAD FINDINGS Anterior circulation: Both internal carotid arteries widely patent to the termini without stenosis or other abnormality. A1 segments widely patent. Normal anterior communicating artery complex. Anterior cerebral arteries widely patent to their distal aspects without stenosis. No M1 stenosis or occlusion. Normal MCA bifurcations. Distal MCA branches well perfused and  symmetric. Posterior circulation: Both vertebral arteries widely patent to the vertebrobasilar junction without stenosis. Left vertebral artery slightly dominant. Both picas patent. Basilar patent to its distal aspect without stenosis. Superior cerebral arteries patent bilaterally. Left PCA supplied primarily via the basilar. Right PCA supplied via a the basilar as well as a robust right posterior communicating artery. Both PCAs well perfused to their distal aspects without stenosis. Venous sinuses: Patent. Anatomic variants: None significant.  No intracranial aneurysm. Review of the MIP images confirms the above findings IMPRESSION: 1. Negative CTA for emergent large vessel occlusion. 2. Bulky next plaque about the right carotid bifurcation/proximal right ICA with associated stenosis of up to 35-40% by NASCET criteria. Additional mild multifocal stenoses present throughout the mid and distal right CCA. 3. Additional mild atherosclerotic change about the left carotid bifurcation without significant stenosis. 4. Otherwise wide patency of the major arterial vasculature of the head and neck. No other hemodynamically significant or correctable stenosis. Electronically Signed   By: Jeannine Boga M.D.   On: 12/03/2019 01:28   MR BRAIN WO CONTRAST  Result Date: 12/03/2019 CLINICAL DATA:  Initial evaluation for acute TIA, left facial droop. EXAM: MRI HEAD WITHOUT CONTRAST TECHNIQUE: Multiplanar, multiecho pulse sequences of the brain and surrounding structures were obtained without intravenous contrast. COMPARISON:  Prior head CT from earlier the same day. FINDINGS: Brain: Cerebral volume within normal limits for patient age. No significant cerebral white matter disease for age. Tiny remote lacunar infarct noted within the left thalamus. Additional small remote lacunar infarct noted at the left lentiform nucleus. No abnormal foci of restricted diffusion to suggest acute or subacute ischemia. Gray-white matter  differentiation well maintained. No encephalomalacia to suggest chronic infarction. No foci of susceptibility artifact to suggest acute or chronic intracranial hemorrhage. No mass lesion, midline shift or mass effect. No hydrocephalus. No extra-axial fluid collection. Major dural sinuses are grossly patent. Pituitary gland and suprasellar region are normal. Midline structures intact and  normal. Vascular: Major intracranial vascular flow voids well maintained and normal in appearance. Skull and upper cervical spine: Craniocervical junction normal. Visualized upper cervical spine within normal limits. Bone marrow signal intensity normal. No scalp soft tissue abnormality. Sinuses/Orbits: Globes and orbital soft tissues within normal limits. Paranasal sinuses are clear. No mastoid effusion. Inner ear structures normal. Other: None. IMPRESSION: 1. No acute intracranial abnormality. 2. 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 3. Otherwise normal brain MRI for age. Electronically Signed   By: Jeannine Boga M.D.   On: 12/03/2019 01:07     ASSESSMENT AND PLAN   64 y.o. male with past medical history significant for hyperlipidemia presents to the emergency department after wife noticed him left facial droop and difficulty moving his leg x 2 episodes. Back to baseline, MRI negative for stroke   Transient ischemic attack   Recommend # MRI of the brain without contrast #CTA head and neck #Transthoracic Echo  # Start patient on ASA 81 mg daily, Plavix 75 daily x 3 weeks after 300mg  load today #Start or continue Atorvastatin 80 mg/other high intensity statin # BP goal: permissive HTN upto 220/120 mmHg  # HBAIC and Lipid profile # Telemetry monitoring # Frequent neuro checks # stroke swallow screen  Please page stroke NP  Or  PA  Or MD from 8am -4 pm  as this patient from this time will be  followed by the stroke.   You can look them up on [email or web address]  Password  [facility]     [facility] Pager Number [phone]

## 2019-12-03 NOTE — Progress Notes (Signed)
HR 43 asymptomatic. Opyd updated.

## 2019-12-03 NOTE — ED Notes (Signed)
Breakfast ordered--  

## 2019-12-04 ENCOUNTER — Inpatient Hospital Stay (HOSPITAL_COMMUNITY): Payer: No Typology Code available for payment source

## 2019-12-04 DIAGNOSIS — G459 Transient cerebral ischemic attack, unspecified: Secondary | ICD-10-CM | POA: Diagnosis not present

## 2019-12-04 DIAGNOSIS — R001 Bradycardia, unspecified: Secondary | ICD-10-CM

## 2019-12-04 DIAGNOSIS — I639 Cerebral infarction, unspecified: Secondary | ICD-10-CM | POA: Diagnosis present

## 2019-12-04 DIAGNOSIS — K219 Gastro-esophageal reflux disease without esophagitis: Secondary | ICD-10-CM | POA: Diagnosis not present

## 2019-12-04 LAB — MAGNESIUM: Magnesium: 2.1 mg/dL (ref 1.7–2.4)

## 2019-12-04 LAB — CBC
HCT: 42.5 % (ref 39.0–52.0)
Hemoglobin: 14.5 g/dL (ref 13.0–17.0)
MCH: 31.5 pg (ref 26.0–34.0)
MCHC: 34.1 g/dL (ref 30.0–36.0)
MCV: 92.4 fL (ref 80.0–100.0)
Platelets: 216 10*3/uL (ref 150–400)
RBC: 4.6 MIL/uL (ref 4.22–5.81)
RDW: 12.4 % (ref 11.5–15.5)
WBC: 7.8 10*3/uL (ref 4.0–10.5)
nRBC: 0 % (ref 0.0–0.2)

## 2019-12-04 LAB — BASIC METABOLIC PANEL
Anion gap: 8 (ref 5–15)
BUN: 9 mg/dL (ref 8–23)
CO2: 24 mmol/L (ref 22–32)
Calcium: 8.9 mg/dL (ref 8.9–10.3)
Chloride: 107 mmol/L (ref 98–111)
Creatinine, Ser: 0.98 mg/dL (ref 0.61–1.24)
GFR calc Af Amer: >60 mL/min (ref >60)
GFR calc non Af Amer: >60 mL/min (ref >60)
Glucose, Bld: 104 mg/dL — ABNORMAL HIGH (ref 70–99)
Potassium: 4.3 mmol/L (ref 3.5–5.1)
Sodium: 139 mmol/L (ref 135–145)

## 2019-12-04 LAB — TSH: TSH: 5.697 u[IU]/mL — ABNORMAL HIGH (ref 0.350–4.500)

## 2019-12-04 LAB — ECHOCARDIOGRAM COMPLETE
Height: 68 in
Weight: 3072 oz

## 2019-12-04 MED ORDER — POLYETHYLENE GLYCOL 3350 17 G PO PACK: 17.0000 g | PACK | Freq: Every day | ORAL | Status: DC | PRN

## 2019-12-04 MED ORDER — ATORVASTATIN CALCIUM 80 MG PO TABS: 80.0000 mg | ORAL_TABLET | Freq: Every day | ORAL | 0 refills | Status: DC

## 2019-12-04 MED ORDER — SENNOSIDES-DOCUSATE SODIUM 8.6-50 MG PO TABS: 2.0000 | ORAL_TABLET | Freq: Every evening | ORAL | Status: DC | PRN

## 2019-12-04 MED ORDER — STUDY - INVESTIGATIONAL MEDICATION
99 refills | Status: DC
Start: 2019-12-04 — End: 2022-11-21

## 2019-12-04 NOTE — Discharge Summary (Signed)
Physician Discharge Summary  Andrew Mendoza GBT:[id number] DOB: September 03, 1955 DOA: 12/02/2019  PCP: Maryland Pink, MD  Admit date: 12/02/2019 Discharge date: 12/04/2019  Admitted From: Home Disposition: Home  Recommendations for Outpatient Follow-up:  1. Follow up with PCP in 1-2 weeks 2. Please obtain BMP/CBC in one week your next doctors visit.  3. Anticoagulation per research study-to be arranged by neurology team 4. Will need outpatient sleep study 5. Follow-up neurology in 3-4 weeks   Discharge Condition: Stable CODE STATUS: Full Diet recommendation: Heart healthy  Brief/Interim Summary: 64 y.o. male with medical history significant of GERD, HLD who was in his usual state of good health. Today about 11AM his wife noted him to have a mild left facial droop and noticed that he had an abnormal gait pushing his left leg forward. Around 6PM he had a second similar episode.  CT head was normal but MRI showed right thalamic infarct, EEG was normal, LDL 110, A1c 5.2.  Neurology recommended aspirin and Plavix for 3 weeks followed by BMS study medication.  Patient cleared by PT.  Hospital course complicated by stable sinus bradycardia, patient remained asymptomatic.  Likely this is chronic heart rate.  Echocardiogram showed-EF 60 to 65%  Stable for discharge.  Patient was enrolled in BMS stroke study by neurology team.  Body mass index is 29.19 kg/m.    Recurrent TIA with right-sided thalamic infarct -CTA head and neck negative for large vessel occlusion.  EEG was normal.  LDL 110.  A1c 5.2.  Patient was seen by neurology who enrolled him in BMS study.  Will need follow-up in 3-4 weeks outpatient.  Placed on Lipitor 80 mg daily.  Essential hypertension -No history.  Blood pressure appears to be in systolic 710G range which is okay due to permissive hypertension, continue to monitor this outpatient.  Should normalize over the next week or so.  Hyperlipidemia -LDL 110.  Discontinue Zocor,  transition to Lipitor 80 mg daily.  Sinus bradycardia -Patient remains asymptomatic.  Currently stable.  Echocardiogram-normal. He would benefit from outpatient sleep study due to CVA, sinus bradycardia   Discharge Diagnoses:  Active Problems:   TIA (transient ischemic attack)   GERD (gastroesophageal reflux disease)   Stroke [facility])   Consultations:  Neurology  Subjective: Doing okay no complaints overnight had sinus bradycardia with heart rate dropping down to 35.  This morning patient remains in 40s-50s but asymptomatic.  Discharge Exam: Vitals:   12/04/19 0759 12/04/19 1220  BP:  136/71  Pulse:    Resp: 14 16  Temp:  98.5 F (36.9 C)  SpO2: 97% 98%   Vitals:   12/04/19 0700 12/04/19 0757 12/04/19 0759 12/04/19 1220  BP:    136/71  Pulse:      Resp: 14 15 14 16   Temp:    98.5 F (36.9 C)  TempSrc:    Oral  SpO2: 100% 97% 97% 98%  Weight:      Height:        General: Pt is alert, awake, not in acute distress Cardiovascular: RRR, S1/S2 +, no rubs, no gallops Respiratory: CTA bilaterally, no wheezing, no rhonchi Abdominal: Soft, NT, ND, bowel sounds + Extremities: no edema, no cyanosis  Discharge Instructions  Discharge Instructions    Ambulatory referral to Neurology   Complete by: As directed    3-4 weeks for acute CVA   Polysomnography 4 or more parameters   Complete by: Jan 04, 2020    Where should this test be performed: [facility]     Allergies as of 12/04/2019   No Known Allergies     Medication List    STOP taking these medications   aspirin 81 MG tablet   simvastatin 20 MG tablet Commonly known as: ZOCOR     TAKE these medications   atorvastatin 80 MG tablet Commonly known as: LIPITOR Take 1 tablet (80 mg total) by mouth daily. Start taking on: December 05, 2019   Investigational - Study Medication BMS yesterday   multivitamin tablet Take 1 tablet by mouth daily.   omeprazole 40 MG capsule Commonly known as:  PRILOSEC Take 40 mg by mouth daily.   vitamin B-12 1000 MCG tablet Commonly known as: CYANOCOBALAMIN Take 1,000 mcg by mouth daily.   Vitamin D3 1.25 MG (50000 UT) Caps Take 1 capsule by mouth daily.   vitamin E 180 MG (400 UNITS) capsule Generic drug: vitamin E Take 400 Units by mouth daily.       Follow-up Information    Maryland Pink, MD. Schedule an appointment as soon as possible for a visit in 1 week(s).   Specialty: Family Medicine Contact information: [place] [address] [phone]        Garvin Fila, MD Follow up in 4 week(s).   Specialties: Neurology, Radiology Contact information: [address] [place] [address] [phone]              No Known Allergies  You were cared for by a hospitalist during your hospital stay. If you have any questions about your discharge medications or the care you received while you were in the hospital after you are discharged, you can call the unit and asked to speak with the hospitalist on call if the hospitalist that took care of you is not available. Once you are discharged, your primary care physician will handle any further medical issues. Please note that no refills for any discharge medications will be authorized once you are discharged, as it is imperative that you return to your primary care physician (or establish a relationship with a primary care physician if you do not have one) for your aftercare needs so that they can reassess your need for medications and monitor your lab values.   Procedures/Studies: CT Code Stroke CTA Head W/WO contrast  Result Date: 12/03/2019 CLINICAL DATA:  Left facial droop.  Slurred speech. EXAM: CT ANGIOGRAPHY HEAD AND NECK TECHNIQUE: Multidetector CT imaging of the head and neck was performed using the standard protocol during bolus administration of intravenous contrast. Multiplanar CT image reconstructions and MIPs were obtained to evaluate the  vascular anatomy. Carotid stenosis measurements (when applicable) are obtained utilizing NASCET criteria, using the distal internal carotid diameter as the denominator. CONTRAST:  100mL OMNIPAQUE IOHEXOL 350 MG/ML SOLN COMPARISON:  Head CT earlier same day.  CT angiography 12/02/2019. FINDINGS: CTA NECK FINDINGS Aortic arch: No significant atherosclerotic change. Branching pattern is normal. No origin stenosis. Right carotid system: Common carotid artery widely patent proximally. Soft plaque affecting the distal common carotid artery over the last several cm with stenosis of 30% compared to the expected diameter of the common carotid artery. At the carotid bifurcation there is soft and calcified plaque. Minimal diameter of the proximal ICA cannot be measured below that the diameter of the cervical ICA in therefore I can not described a stenosis. Beyond the bulb, the cervical ICA is widely patent. Left carotid system: Common carotid artery shows mild atherosclerotic plaque but no  stenosis. There is an ulcerated appearance within the soft plaque at the C5-6 level. At the carotid bifurcation, there is soft and calcified plaque but no stenosis. Cervical ICA widely patent. Vertebral arteries: Both vertebral artery origins are widely patent. Both vertebral arteries appear normal through the cervical region to the foramen magnum. Skeleton: Ordinary cervical spondylosis. Other neck: No mass or lymphadenopathy. Upper chest: Negative Review of the MIP images confirms the above findings CTA HEAD FINDINGS Anterior circulation: Both internal carotid arteries widely patent through the skull base and siphon regions. No siphon stenosis. The anterior and middle cerebral vessels are patent. No large or medium vessel occlusion or correctable proximal stenosis. Large right posterior communicating artery. Posterior circulation: Both vertebral arteries widely patent to the basilar. No basilar stenosis. Posterior circulation branch vessels  are patent and normal. Venous sinuses: Patent and normal. Anatomic variants: None significant otherwise. Review of the MIP images confirms the above findings IMPRESSION: No intracranial large or medium vessel occlusion. Atherosclerotic change affecting both common carotid arteries. 30% narrowing of the distal several cm of the right common carotid artery due to soft plaque. Soft plaque in the left common carotid artery shows mild ulceration at the C5-6 level. Atherosclerotic disease at both carotid bifurcation and ICA bulb regions but no measurable stenosis compared to the diameter of the more distal cervical ICA. These results were communicated to Dr. Leonie Man at [place] 7/6/2021by text page via the [facility] messaging system. Electronically Signed   By: Nelson Chimes M.D.   On: 12/03/2019 17:03   CT Angio Head W or Wo Contrast  Result Date: 12/03/2019 CLINICAL DATA:  Initial evaluation for TIA, left-sided weakness. EXAM: CT ANGIOGRAPHY HEAD AND NECK TECHNIQUE: Multidetector CT imaging of the head and neck was performed using the standard protocol during bolus administration of intravenous contrast. Multiplanar CT image reconstructions and MIPs were obtained to evaluate the vascular anatomy. Carotid stenosis measurements (when applicable) are obtained utilizing NASCET criteria, using the distal internal carotid diameter as the denominator. CONTRAST:  39mL OMNIPAQUE IOHEXOL 350 MG/ML SOLN COMPARISON:  Comparison made with prior brain MRI and head CT from earlier same day. FINDINGS: CTA NECK FINDINGS Aortic arch: Visualized aortic arch of normal caliber with normal 3 vessel morphology. No hemodynamically significant stenosis or other abnormality seen about the origin of the great vessels. Right carotid system: Scattered eccentric soft plaque seen throughout the mid and distal right CCA with associated mild multifocal stenosis. Bulky calcified noncalcified plaque about the right bifurcation/proximal right ICA with  associated stenosis of up to approximately 35-40% by NASCET criteria. Right ICA otherwise widely patent distally to the skull base without stenosis, dissection or occlusion. Left carotid system: Left CCA patent from its origin to the bifurcation without stenosis. Mild calcified plaque about the left bifurcation without hemodynamically significant stenosis. Left ICA widely patent distally without stenosis, dissection or occlusion. Vertebral arteries: Both vertebral arteries arise from the subclavian arteries. No proximal subclavian artery stenosis. Evaluation of the distal V2/V3 segment somewhat limited by adjacent venous contamination, worse on the left. Visualized vertebral arteries widely patent without stenosis, dissection or occlusion. Skeleton: No acute osseous abnormality. No discrete or worrisome osseous lesions. Mild multilevel cervical spondylosis without high-grade stenosis. Other neck: No other acute soft tissue abnormality within the neck. No mass lesion or adenopathy. Upper chest: Visualized upper chest demonstrates no acute finding. Review of the MIP images confirms the above findings CTA HEAD FINDINGS Anterior circulation: Both internal carotid arteries widely patent to the termini without stenosis  or other abnormality. A1 segments widely patent. Normal anterior communicating artery complex. Anterior cerebral arteries widely patent to their distal aspects without stenosis. No M1 stenosis or occlusion. Normal MCA bifurcations. Distal MCA branches well perfused and symmetric. Posterior circulation: Both vertebral arteries widely patent to the vertebrobasilar junction without stenosis. Left vertebral artery slightly dominant. Both picas patent. Basilar patent to its distal aspect without stenosis. Superior cerebral arteries patent bilaterally. Left PCA supplied primarily via the basilar. Right PCA supplied via a the basilar as well as a robust right posterior communicating artery. Both PCAs well perfused  to their distal aspects without stenosis. Venous sinuses: Patent. Anatomic variants: None significant.  No intracranial aneurysm. Review of the MIP images confirms the above findings IMPRESSION: 1. Negative CTA for emergent large vessel occlusion. 2. Bulky next plaque about the right carotid bifurcation/proximal right ICA with associated stenosis of up to 35-40% by NASCET criteria. Additional mild multifocal stenoses present throughout the mid and distal right CCA. 3. Additional mild atherosclerotic change about the left carotid bifurcation without significant stenosis. 4. Otherwise wide patency of the major arterial vasculature of the head and neck. No other hemodynamically significant or correctable stenosis. Electronically Signed   By: Jeannine Boga M.D.   On: 12/03/2019 01:28   CT HEAD WO CONTRAST  Result Date: 12/02/2019 CLINICAL DATA:  Left-sided facial droop and drooling. EXAM: CT HEAD WITHOUT CONTRAST TECHNIQUE: Contiguous axial images were obtained from the base of the skull through the vertex without intravenous contrast. COMPARISON:  None. FINDINGS: Brain: No evidence of acute infarction, hemorrhage, hydrocephalus, extra-axial collection or mass lesion/mass effect. Vascular: No hyperdense vessel or unexpected calcification. Skull: Normal. Negative for fracture or focal lesion. Sinuses/Orbits: No acute finding. Other: None. IMPRESSION: No acute intracranial process. Electronically Signed   By: Zerita Boers M.D.   On: 12/02/2019 20:26   CT Code Stroke CTA Neck W/WO contrast  Result Date: 12/03/2019 CLINICAL DATA:  Left facial droop.  Slurred speech. EXAM: CT ANGIOGRAPHY HEAD AND NECK TECHNIQUE: Multidetector CT imaging of the head and neck was performed using the standard protocol during bolus administration of intravenous contrast. Multiplanar CT image reconstructions and MIPs were obtained to evaluate the vascular anatomy. Carotid stenosis measurements (when applicable) are obtained  utilizing NASCET criteria, using the distal internal carotid diameter as the denominator. CONTRAST:  36mL OMNIPAQUE IOHEXOL 350 MG/ML SOLN COMPARISON:  Head CT earlier same day.  CT angiography 12/02/2019. FINDINGS: CTA NECK FINDINGS Aortic arch: No significant atherosclerotic change. Branching pattern is normal. No origin stenosis. Right carotid system: Common carotid artery widely patent proximally. Soft plaque affecting the distal common carotid artery over the last several cm with stenosis of 30% compared to the expected diameter of the common carotid artery. At the carotid bifurcation there is soft and calcified plaque. Minimal diameter of the proximal ICA cannot be measured below that the diameter of the cervical ICA in therefore I can not described a stenosis. Beyond the bulb, the cervical ICA is widely patent. Left carotid system: Common carotid artery shows mild atherosclerotic plaque but no stenosis. There is an ulcerated appearance within the soft plaque at the C5-6 level. At the carotid bifurcation, there is soft and calcified plaque but no stenosis. Cervical ICA widely patent. Vertebral arteries: Both vertebral artery origins are widely patent. Both vertebral arteries appear normal through the cervical region to the foramen magnum. Skeleton: Ordinary cervical spondylosis. Other neck: No mass or lymphadenopathy. Upper chest: Negative Review of the MIP images confirms the above findings  CTA HEAD FINDINGS Anterior circulation: Both internal carotid arteries widely patent through the skull base and siphon regions. No siphon stenosis. The anterior and middle cerebral vessels are patent. No large or medium vessel occlusion or correctable proximal stenosis. Large right posterior communicating artery. Posterior circulation: Both vertebral arteries widely patent to the basilar. No basilar stenosis. Posterior circulation branch vessels are patent and normal. Venous sinuses: Patent and normal. Anatomic variants:  None significant otherwise. Review of the MIP images confirms the above findings IMPRESSION: No intracranial large or medium vessel occlusion. Atherosclerotic change affecting both common carotid arteries. 30% narrowing of the distal several cm of the right common carotid artery due to soft plaque. Soft plaque in the left common carotid artery shows mild ulceration at the C5-6 level. Atherosclerotic disease at both carotid bifurcation and ICA bulb regions but no measurable stenosis compared to the diameter of the more distal cervical ICA. These results were communicated to Dr. Leonie Man at [place] 7/6/2021by text page via the [facility] messaging system. Electronically Signed   By: Nelson Chimes M.D.   On: 12/03/2019 17:03   CT Angio Neck W and/or Wo Contrast  Result Date: 12/03/2019 CLINICAL DATA:  Initial evaluation for TIA, left-sided weakness. EXAM: CT ANGIOGRAPHY HEAD AND NECK TECHNIQUE: Multidetector CT imaging of the head and neck was performed using the standard protocol during bolus administration of intravenous contrast. Multiplanar CT image reconstructions and MIPs were obtained to evaluate the vascular anatomy. Carotid stenosis measurements (when applicable) are obtained utilizing NASCET criteria, using the distal internal carotid diameter as the denominator. CONTRAST:  65mL OMNIPAQUE IOHEXOL 350 MG/ML SOLN COMPARISON:  Comparison made with prior brain MRI and head CT from earlier same day. FINDINGS: CTA NECK FINDINGS Aortic arch: Visualized aortic arch of normal caliber with normal 3 vessel morphology. No hemodynamically significant stenosis or other abnormality seen about the origin of the great vessels. Right carotid system: Scattered eccentric soft plaque seen throughout the mid and distal right CCA with associated mild multifocal stenosis. Bulky calcified noncalcified plaque about the right bifurcation/proximal right ICA with associated stenosis of up to approximately 35-40% by NASCET criteria. Right ICA  otherwise widely patent distally to the skull base without stenosis, dissection or occlusion. Left carotid system: Left CCA patent from its origin to the bifurcation without stenosis. Mild calcified plaque about the left bifurcation without hemodynamically significant stenosis. Left ICA widely patent distally without stenosis, dissection or occlusion. Vertebral arteries: Both vertebral arteries arise from the subclavian arteries. No proximal subclavian artery stenosis. Evaluation of the distal V2/V3 segment somewhat limited by adjacent venous contamination, worse on the left. Visualized vertebral arteries widely patent without stenosis, dissection or occlusion. Skeleton: No acute osseous abnormality. No discrete or worrisome osseous lesions. Mild multilevel cervical spondylosis without high-grade stenosis. Other neck: No other acute soft tissue abnormality within the neck. No mass lesion or adenopathy. Upper chest: Visualized upper chest demonstrates no acute finding. Review of the MIP images confirms the above findings CTA HEAD FINDINGS Anterior circulation: Both internal carotid arteries widely patent to the termini without stenosis or other abnormality. A1 segments widely patent. Normal anterior communicating artery complex. Anterior cerebral arteries widely patent to their distal aspects without stenosis. No M1 stenosis or occlusion. Normal MCA bifurcations. Distal MCA branches well perfused and symmetric. Posterior circulation: Both vertebral arteries widely patent to the vertebrobasilar junction without stenosis. Left vertebral artery slightly dominant. Both picas patent. Basilar patent to its distal aspect without stenosis. Superior cerebral arteries patent bilaterally. Left PCA supplied  primarily via the basilar. Right PCA supplied via a the basilar as well as a robust right posterior communicating artery. Both PCAs well perfused to their distal aspects without stenosis. Venous sinuses: Patent. Anatomic  variants: None significant.  No intracranial aneurysm. Review of the MIP images confirms the above findings IMPRESSION: 1. Negative CTA for emergent large vessel occlusion. 2. Bulky next plaque about the right carotid bifurcation/proximal right ICA with associated stenosis of up to 35-40% by NASCET criteria. Additional mild multifocal stenoses present throughout the mid and distal right CCA. 3. Additional mild atherosclerotic change about the left carotid bifurcation without significant stenosis. 4. Otherwise wide patency of the major arterial vasculature of the head and neck. No other hemodynamically significant or correctable stenosis. Electronically Signed   By: Jeannine Boga M.D.   On: 12/03/2019 01:28   MR BRAIN WO CONTRAST  Result Date: 12/03/2019 CLINICAL DATA:  Stroke follow-up.  Axiomatic stroke study protocol. EXAM: MRI HEAD WITHOUT CONTRAST TECHNIQUE: Multiplanar, multiecho pulse sequences of the brain and surrounding structures were obtained without intravenous contrast. COMPARISON:  12/02/2019 FINDINGS: Brain: There is a small focus of abnormal diffusion restriction at the anterior right thalamus, new since the prior study. No other diffusion abnormality. Normal white matter signal. Normal volume of CSF spaces. No chronic microhemorrhage. Normal midline structures. Vascular: Normal flow voids. Skull and upper cervical spine: Normal marrow signal. Sinuses/Orbits: Negative. Other: None. IMPRESSION: Small focus of acute ischemia at the anterior right thalamus, new since the prior study. Electronically Signed   By: Ulyses Jarred M.D.   On: 12/03/2019 21:34   MR BRAIN WO CONTRAST  Result Date: 12/03/2019 CLINICAL DATA:  Initial evaluation for acute TIA, left facial droop. EXAM: MRI HEAD WITHOUT CONTRAST TECHNIQUE: Multiplanar, multiecho pulse sequences of the brain and surrounding structures were obtained without intravenous contrast. COMPARISON:  Prior head CT from earlier the same day.  FINDINGS: Brain: Cerebral volume within normal limits for patient age. No significant cerebral white matter disease for age. Tiny remote lacunar infarct noted within the left thalamus. Additional small remote lacunar infarct noted at the left lentiform nucleus. No abnormal foci of restricted diffusion to suggest acute or subacute ischemia. Gray-white matter differentiation well maintained. No encephalomalacia to suggest chronic infarction. No foci of susceptibility artifact to suggest acute or chronic intracranial hemorrhage. No mass lesion, midline shift or mass effect. No hydrocephalus. No extra-axial fluid collection. Major dural sinuses are grossly patent. Pituitary gland and suprasellar region are normal. Midline structures intact and normal. Vascular: Major intracranial vascular flow voids well maintained and normal in appearance. Skull and upper cervical spine: Craniocervical junction normal. Visualized upper cervical spine within normal limits. Bone marrow signal intensity normal. No scalp soft tissue abnormality. Sinuses/Orbits: Globes and orbital soft tissues within normal limits. Paranasal sinuses are clear. No mastoid effusion. Inner ear structures normal. Other: None. IMPRESSION: 1. No acute intracranial abnormality. 2. 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 3. Otherwise normal brain MRI for age. Electronically Signed   By: Jeannine Boga M.D.   On: 12/03/2019 01:07   EEG adult  Result Date: 12/03/2019 Alexis Goodell, MD     12/03/2019  1:24 PM ELECTROENCEPHALOGRAM REPORT Patient: Andrew Mendoza       Room #: 177L EEG No. ID: [id number] Age: 64 y.o.        Sex: male Requesting Physician: Earnest Conroy Report Date:  12/03/2019       Interpreting Physician: Alexis Goodell History: Andrew Mendoza is  an 64 y.o. male with episode of facial droop and difficulty with gait Medications: ASA, Lipitor Conditions of Recording:  This is a 21 channel routine scalp EEG  performed with bipolar and monopolar montages arranged in accordance to the international 10/20 system of electrode placement. One channel was dedicated to EKG recording. The patient is in the awake, drowsy and asleep states. Description:  The waking background activity consists of a low voltage, symmetrical, fairly well organized, 10 Hz alpha activity, seen from the parieto-occipital and posterior temporal regions.  Low voltage fast activity, poorly organized, is seen anteriorly and is at times superimposed on more posterior regions.  A mixture of theta and alpha rhythms are seen from the central and temporal regions. The patient drowses with slowing to irregular, low voltage theta and beta activity.  The patient goes in to a light sleep with symmetrical sleep spindles, vertex central sharp transients and irregular slow activity. No epileptiform activity is noted.    Hyperventilation and intermittent photic stimulation were not performed. IMPRESSION: Normal electroencephalogram, awake, asleep and with activation procedures. There are no focal lateralizing or epileptiform features. Alexis Goodell, MD Neurology [phone] 12/03/2019, 1:21 PM   CT HEAD CODE STROKE WO CONTRAST`  Result Date: 12/03/2019 CLINICAL DATA:  Code stroke.  Left facial droop and slurred speech. EXAM: CT HEAD WITHOUT CONTRAST TECHNIQUE: Contiguous axial images were obtained from the base of the skull through the vertex without intravenous contrast. COMPARISON:  CT 12/02/2019.  MRI 12/02/2019. FINDINGS: Brain: Normal appearance without evidence of old or acute infarction, mass lesion, hemorrhage, hydrocephalus or extra-axial collection. Vascular: Minor calcification of the major vessels at the base of the brain. Skull: Negative Sinuses/Orbits: Clear/normal Other: None ASPECTS ([place] Stroke Program Early CT Score) - Ganglionic level infarction (caudate, lentiform nuclei, internal capsule, insula, M1-M3 cortex): 7 - Supraganglionic infarction  (M4-M6 cortex): 3 Total score (0-10 with 10 being normal): 10 IMPRESSION: 1. Normal head CT 2. ASPECTS is 10 3. These results were communicated to Dr. Leonie Man at 4:43 pmon 7/6/2021by text page via the [facility] messaging system. Electronically Signed   By: Nelson Chimes M.D.   On: 12/03/2019 16:44      The results of significant diagnostics from this hospitalization (including imaging, microbiology, ancillary and laboratory) are listed below for reference.     Microbiology: Recent Results (from the past 240 hour(s))  SARS Coronavirus 2 by RT PCR (hospital order, performed in [facility] hospital lab) Nasopharyngeal Nasopharyngeal Swab     Status: None   Collection Time: 12/02/19 11:03 PM   Specimen: Nasopharyngeal Swab  Result Value Ref Range Status   SARS Coronavirus 2 NEGATIVE NEGATIVE Final    Comment: (NOTE) SARS-CoV-2 target nucleic acids are NOT DETECTED.  The SARS-CoV-2 RNA is generally detectable in upper and lower respiratory specimens during the acute phase of infection. The lowest concentration of SARS-CoV-2 viral copies this assay can detect is 250 copies / mL. A negative result does not preclude SARS-CoV-2 infection and should not be used as the sole basis for treatment or other patient management decisions.  A negative result may occur with improper specimen collection / handling, submission of specimen other than nasopharyngeal swab, presence of viral mutation(s) within the areas targeted by this assay, and inadequate number of viral copies (<250 copies / mL). A negative result must be combined with clinical observations, patient history, and epidemiological information.  Fact Sheet for Patients:   StrictlyIdeas.no  Fact Sheet for Healthcare Providers: BankingDealers.co.za  This test is not yet  approved or  cleared by the [place] and has been authorized for detection and/or diagnosis of SARS-CoV-2 by FDA under an  Emergency Use Authorization (EUA).  This EUA will remain in effect (meaning this test can be used) for the duration of the COVID-19 declaration under Section 564(b)(1) of the Act, 21 U.S.C. section 360bbb-3(b)(1), unless the authorization is terminated or revoked sooner.  Performed at [facility], [place] [address], [place], [place] [address]      Labs: BNP (last 3 results) No results for input(s): BNP in the last 8760 hours. Basic Metabolic Panel: Recent Labs  Lab 12/02/19 1954 12/02/19 2012 12/03/19 1636 12/04/19 0511  NA 137 138  --  139  K 3.8 3.8  --  4.3  CL 104 103  --  107  CO2 21*  --   --  24  GLUCOSE 141* 152*  --  104*  BUN 16 18  --  9  CREATININE 1.07 1.10  --  0.98  CALCIUM 9.1  --   --  8.9  MG  --   --  2.3 2.1  PHOS  --   --  3.4  --    Liver Function Tests: Recent Labs  Lab 12/02/19 1954  AST 23  ALT 28  ALKPHOS 70  BILITOT 1.0  PROT 7.1  ALBUMIN 4.3   No results for input(s): LIPASE, AMYLASE in the last 168 hours. No results for input(s): AMMONIA in the last 168 hours. CBC: Recent Labs  Lab 12/02/19 1954 12/02/19 2012 12/04/19 0511  WBC 6.6  --  7.8  NEUTROABS 4.9  --   --   HGB 15.1 14.3 14.5  HCT 45.1 42.0 42.5  MCV 91.5  --  92.4  PLT 251  --  216   Cardiac Enzymes: No results for input(s): CKTOTAL, CKMB, CKMBINDEX, TROPONINI in the last 168 hours. BNP: Invalid input(s): POCBNP CBG: No results for input(s): GLUCAP in the last 168 hours. D-Dimer No results for input(s): DDIMER in the last 72 hours. Hgb A1c Recent Labs    12/02/19 1954  HGBA1C 5.2   Lipid Profile Recent Labs    12/03/19 0316  CHOL 174  HDL 53  LDLCALC 110*  TRIG 55  CHOLHDL 3.3   Thyroid function studies Recent Labs    12/04/19 0511  TSH 5.697*   Anemia work up No results for input(s): VITAMINB12, FOLATE, FERRITIN, TIBC, IRON, RETICCTPCT in the last 72 hours. Urinalysis No results found for: COLORURINE, APPEARANCEUR, [place], [place],  [place], [place], [place], [place], PROTEINUR, UROBILINOGEN, NITRITE, LEUKOCYTESUR Sepsis Labs Invalid input(s): PROCALCITONIN,  WBC,  LACTICIDVEN Microbiology Recent Results (from the past 240 hour(s))  SARS Coronavirus 2 by RT PCR (hospital order, performed in [facility] hospital lab) Nasopharyngeal Nasopharyngeal Swab     Status: None   Collection Time: 12/02/19 11:03 PM   Specimen: Nasopharyngeal Swab  Result Value Ref Range Status   SARS Coronavirus 2 NEGATIVE NEGATIVE Final    Comment: (NOTE) SARS-CoV-2 target nucleic acids are NOT DETECTED.  The SARS-CoV-2 RNA is generally detectable in upper and lower respiratory specimens during the acute phase of infection. The lowest concentration of SARS-CoV-2 viral copies this assay can detect is 250 copies / mL. A negative result does not preclude SARS-CoV-2 infection and should not be used as the sole basis for treatment or other patient management decisions.  A negative result may occur with improper specimen collection / handling, submission of specimen other than nasopharyngeal swab, presence  of viral mutation(s) within the areas targeted by this assay, and inadequate number of viral copies (<250 copies / mL). A negative result must be combined with clinical observations, patient history, and epidemiological information.  Fact Sheet for Patients:   StrictlyIdeas.no  Fact Sheet for Healthcare Providers: BankingDealers.co.za  This test is not yet approved or  cleared by the [place] FDA and has been authorized for detection and/or diagnosis of SARS-CoV-2 by FDA under an Emergency Use Authorization (EUA).  This EUA will remain in effect (meaning this test can be used) for the duration of the COVID-19 declaration under Section 564(b)(1) of the Act, 21 U.S.C. section 360bbb-3(b)(1), unless the authorization is terminated or revoked sooner.  Performed at [facility], [place] [address] [place], [place] [address]      Time coordinating discharge:  I have spent 35 minutes face to face with the patient and on the ward discussing the patients care, assessment, plan and disposition with other care givers. >50% of the time was devoted counseling the patient about the risks and benefits of treatment/Discharge disposition and coordinating care.   SIGNED:   Damita Lack, MD  [facility] 12/04/2019, 1:42 PM   If 7PM-7AM, please contact night-coverage

## 2019-12-04 NOTE — Progress Notes (Addendum)
STROKE TEAM PROGRESS NOTE   INTERVAL HISTORY  Patient is sitting on his bed working with physical therapist.  He remained stable overnight and has had no further fluctuation in his deficits recurrent TIAs.  Neurological exam is nonfocal this morning.  Vital signs are stable.  He underwent BMS stroke study specific MRI scan last night which showed a small right thalamic infarct which had not been seen on the MRI earlier yesterday.  Echocardiogram is pending.  His heart rate remains bradycardic in the mid 40s.  He also appears to be at risk for sleep apnea and has never had a sleep study evaluation Vitals:   12/04/19 0700 12/04/19 0757 12/04/19 0759 12/04/19 1220  BP:    136/71  Pulse:      Resp: 14 15 14 16   Temp:    98.5 F (36.9 C)  TempSrc:    Oral  SpO2: 100% 97% 97% 98%  Weight:      Height:        CBC:  Recent Labs  Lab 12/02/19 1954 12/02/19 1954 12/02/19 2012 12/04/19 0511  WBC 6.6  --   --  7.8  NEUTROABS 4.9  --   --   --   HGB 15.1   < > 14.3 14.5  HCT 45.1   < > 42.0 42.5  MCV 91.5  --   --  92.4  PLT 251  --   --  216   < > = values in this interval not displayed.    Basic Metabolic Panel:  Recent Labs  Lab 12/02/19 1954 12/02/19 1954 12/02/19 2012 12/03/19 1636 12/04/19 0511  NA 137   < > 138  --  139  K 3.8   < > 3.8  --  4.3  CL 104   < > 103  --  107  CO2 21*  --   --   --  24  GLUCOSE 141*   < > 152*  --  104*  BUN 16   < > 18  --  9  CREATININE 1.07   < > 1.10  --  0.98  CALCIUM 9.1  --   --   --  8.9  MG  --   --   --  2.3 2.1  PHOS  --   --   --  3.4  --    < > = values in this interval not displayed.   Lipid Panel:     Component Value Date/Time   CHOL 174 12/03/2019 0316   TRIG 55 12/03/2019 0316   HDL 53 12/03/2019 0316   CHOLHDL 3.3 12/03/2019 0316   VLDL 11 12/03/2019 0316   LDLCALC 110 (H) 12/03/2019 0316   HgbA1c:  Lab Results  Component Value Date   HGBA1C 5.2 12/02/2019   Urine Drug Screen: No results found for: LABOPIA,  COCAINSCRNUR, LABBENZ, AMPHETMU, THCU, LABBARB  Alcohol Level No results found for: [facility]  IMAGING past 24 hours CT Code Stroke CTA Head W/WO contrast  Result Date: 12/03/2019 CLINICAL DATA:  Left facial droop.  Slurred speech. EXAM: CT ANGIOGRAPHY HEAD AND NECK TECHNIQUE: Multidetector CT imaging of the head and neck was performed using the standard protocol during bolus administration of intravenous contrast. Multiplanar CT image reconstructions and MIPs were obtained to evaluate the vascular anatomy. Carotid stenosis measurements (when applicable) are obtained utilizing NASCET criteria, using the distal internal carotid diameter as the denominator. CONTRAST:  94mL OMNIPAQUE IOHEXOL 350 MG/ML SOLN COMPARISON:  Head CT earlier  same day.  CT angiography 12/02/2019. FINDINGS: CTA NECK FINDINGS Aortic arch: No significant atherosclerotic change. Branching pattern is normal. No origin stenosis. Right carotid system: Common carotid artery widely patent proximally. Soft plaque affecting the distal common carotid artery over the last several cm with stenosis of 30% compared to the expected diameter of the common carotid artery. At the carotid bifurcation there is soft and calcified plaque. Minimal diameter of the proximal ICA cannot be measured below that the diameter of the cervical ICA in therefore I can not described a stenosis. Beyond the bulb, the cervical ICA is widely patent. Left carotid system: Common carotid artery shows mild atherosclerotic plaque but no stenosis. There is an ulcerated appearance within the soft plaque at the C5-6 level. At the carotid bifurcation, there is soft and calcified plaque but no stenosis. Cervical ICA widely patent. Vertebral arteries: Both vertebral artery origins are widely patent. Both vertebral arteries appear normal through the cervical region to the foramen magnum. Skeleton: Ordinary cervical spondylosis. Other neck: No mass or lymphadenopathy. Upper chest: Negative Review  of the MIP images confirms the above findings CTA HEAD FINDINGS Anterior circulation: Both internal carotid arteries widely patent through the skull base and siphon regions. No siphon stenosis. The anterior and middle cerebral vessels are patent. No large or medium vessel occlusion or correctable proximal stenosis. Large right posterior communicating artery. Posterior circulation: Both vertebral arteries widely patent to the basilar. No basilar stenosis. Posterior circulation branch vessels are patent and normal. Venous sinuses: Patent and normal. Anatomic variants: None significant otherwise. Review of the MIP images confirms the above findings IMPRESSION: No intracranial large or medium vessel occlusion. Atherosclerotic change affecting both common carotid arteries. 30% narrowing of the distal several cm of the right common carotid artery due to soft plaque. Soft plaque in the left common carotid artery shows mild ulceration at the C5-6 level. Atherosclerotic disease at both carotid bifurcation and ICA bulb regions but no measurable stenosis compared to the diameter of the more distal cervical ICA. These results were communicated to Dr. Leonie Man at [place] 7/6/2021by text page via the [facility] messaging system. Electronically Signed   By: Nelson Chimes M.D.   On: 12/03/2019 17:03   CT Code Stroke CTA Neck W/WO contrast  Result Date: 12/03/2019 CLINICAL DATA:  Left facial droop.  Slurred speech. EXAM: CT ANGIOGRAPHY HEAD AND NECK TECHNIQUE: Multidetector CT imaging of the head and neck was performed using the standard protocol during bolus administration of intravenous contrast. Multiplanar CT image reconstructions and MIPs were obtained to evaluate the vascular anatomy. Carotid stenosis measurements (when applicable) are obtained utilizing NASCET criteria, using the distal internal carotid diameter as the denominator. CONTRAST:  71mL OMNIPAQUE IOHEXOL 350 MG/ML SOLN COMPARISON:  Head CT earlier same day.  CT  angiography 12/02/2019. FINDINGS: CTA NECK FINDINGS Aortic arch: No significant atherosclerotic change. Branching pattern is normal. No origin stenosis. Right carotid system: Common carotid artery widely patent proximally. Soft plaque affecting the distal common carotid artery over the last several cm with stenosis of 30% compared to the expected diameter of the common carotid artery. At the carotid bifurcation there is soft and calcified plaque. Minimal diameter of the proximal ICA cannot be measured below that the diameter of the cervical ICA in therefore I can not described a stenosis. Beyond the bulb, the cervical ICA is widely patent. Left carotid system: Common carotid artery shows mild atherosclerotic plaque but no stenosis. There is an ulcerated appearance within the soft plaque at the  C5-6 level. At the carotid bifurcation, there is soft and calcified plaque but no stenosis. Cervical ICA widely patent. Vertebral arteries: Both vertebral artery origins are widely patent. Both vertebral arteries appear normal through the cervical region to the foramen magnum. Skeleton: Ordinary cervical spondylosis. Other neck: No mass or lymphadenopathy. Upper chest: Negative Review of the MIP images confirms the above findings CTA HEAD FINDINGS Anterior circulation: Both internal carotid arteries widely patent through the skull base and siphon regions. No siphon stenosis. The anterior and middle cerebral vessels are patent. No large or medium vessel occlusion or correctable proximal stenosis. Large right posterior communicating artery. Posterior circulation: Both vertebral arteries widely patent to the basilar. No basilar stenosis. Posterior circulation branch vessels are patent and normal. Venous sinuses: Patent and normal. Anatomic variants: None significant otherwise. Review of the MIP images confirms the above findings IMPRESSION: No intracranial large or medium vessel occlusion. Atherosclerotic change affecting both  common carotid arteries. 30% narrowing of the distal several cm of the right common carotid artery due to soft plaque. Soft plaque in the left common carotid artery shows mild ulceration at the C5-6 level. Atherosclerotic disease at both carotid bifurcation and ICA bulb regions but no measurable stenosis compared to the diameter of the more distal cervical ICA. These results were communicated to Dr. Leonie Man at [place] 7/6/2021by text page via the [facility] messaging system. Electronically Signed   By: Nelson Chimes M.D.   On: 12/03/2019 17:03   MR BRAIN WO CONTRAST  Result Date: 12/03/2019 CLINICAL DATA:  Stroke follow-up.  Axiomatic stroke study protocol. EXAM: MRI HEAD WITHOUT CONTRAST TECHNIQUE: Multiplanar, multiecho pulse sequences of the brain and surrounding structures were obtained without intravenous contrast. COMPARISON:  12/02/2019 FINDINGS: Brain: There is a small focus of abnormal diffusion restriction at the anterior right thalamus, new since the prior study. No other diffusion abnormality. Normal white matter signal. Normal volume of CSF spaces. No chronic microhemorrhage. Normal midline structures. Vascular: Normal flow voids. Skull and upper cervical spine: Normal marrow signal. Sinuses/Orbits: Negative. Other: None. IMPRESSION: Small focus of acute ischemia at the anterior right thalamus, new since the prior study. Electronically Signed   By: Ulyses Jarred M.D.   On: 12/03/2019 21:34   EEG adult  Result Date: 12/03/2019 Alexis Goodell, MD     12/03/2019  1:24 PM ELECTROENCEPHALOGRAM REPORT Patient: Andrew Mendoza       Room #: 329J EEG No. ID: [id number] Age: 64 y.o.        Sex: male Requesting Physician: Earnest Conroy Report Date:  12/03/2019       Interpreting Physician: Alexis Goodell History: Andrew Mendoza is an 64 y.o. male with episode of facial droop and difficulty with gait Medications: ASA, Lipitor Conditions of Recording:  This is a 21 channel routine scalp EEG performed with bipolar and  monopolar montages arranged in accordance to the international 10/20 system of electrode placement. One channel was dedicated to EKG recording. The patient is in the awake, drowsy and asleep states. Description:  The waking background activity consists of a low voltage, symmetrical, fairly well organized, 10 Hz alpha activity, seen from the parieto-occipital and posterior temporal regions.  Low voltage fast activity, poorly organized, is seen anteriorly and is at times superimposed on more posterior regions.  A mixture of theta and alpha rhythms are seen from the central and temporal regions. The patient drowses with slowing to irregular, low voltage theta and beta activity.  The patient goes in to a light sleep  with symmetrical sleep spindles, vertex central sharp transients and irregular slow activity. No epileptiform activity is noted.    Hyperventilation and intermittent photic stimulation were not performed. IMPRESSION: Normal electroencephalogram, awake, asleep and with activation procedures. There are no focal lateralizing or epileptiform features. Alexis Goodell, MD Neurology [phone] 12/03/2019, 1:21 PM   CT HEAD CODE STROKE WO CONTRAST`  Result Date: 12/03/2019 CLINICAL DATA:  Code stroke.  Left facial droop and slurred speech. EXAM: CT HEAD WITHOUT CONTRAST TECHNIQUE: Contiguous axial images were obtained from the base of the skull through the vertex without intravenous contrast. COMPARISON:  CT 12/02/2019.  MRI 12/02/2019. FINDINGS: Brain: Normal appearance without evidence of old or acute infarction, mass lesion, hemorrhage, hydrocephalus or extra-axial collection. Vascular: Minor calcification of the major vessels at the base of the brain. Skull: Negative Sinuses/Orbits: Clear/normal Other: None ASPECTS ([place] Stroke Program Early CT Score) - Ganglionic level infarction (caudate, lentiform nuclei, internal capsule, insula, M1-M3 cortex): 7 - Supraganglionic infarction (M4-M6 cortex): 3 Total  score (0-10 with 10 being normal): 10 IMPRESSION: 1. Normal head CT 2. ASPECTS is 10 3. These results were communicated to Dr. Leonie Man at 4:43 pmon 7/6/2021by text page via the [facility] messaging system. Electronically Signed   By: Nelson Chimes M.D.   On: 12/03/2019 16:44    PHYSICAL EXAM Mildly obese middle-aged Caucasian male not in distress. . Afebrile. Head is nontraumatic. Neck is supple without bruit.    Cardiac exam no murmur or gallop. Lungs are clear to auscultation. Distal pulses are well felt. Neurological Exam ;  Awake  Alert oriented x 3. Normal speech and language.eye movements full without nystagmus.fundi were not visualized. Vision acuity and fields appear normal. Hearing is normal. Palatal movements are normal. Face symmetric. Tongue midline. Normal strength, tone, reflexes and coordination. Normal sensation. Gait deferred. NIH stroke scale 0   ASSESSMENT/PLAN Mr. Andrew Mendoza is a 64 y.o. male with history of HLD presenting with transient L facial droop and difficulty moving his leg and nonsensical slurred speech x 2.    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      MRI  No acute abnormality. Old L lentiform nucleus and L thalamic lacunes.  Initially but repeat MRI shows right thalamic lacunar infarct  CTA head & neck no LVO. R ICA plaque 35-40% w/ mild mid and distal R CCA atherosclerosis. L ICA bifurcation atherosclerosis.   2D Echo pending   EEG normal  LDL 110  HgbA1c 5.2  Lovenox 40 mg sq daily for VTE prophylaxis  aspirin 81 mg every other day prior to admission, now on aspirin 81 mg daily and clopidogrel 75 mg daily following plavix load. D/c on BMS stroke study  Medication   Therapy recommendations: None  disposition: Home No Hx Hypertension  BP as high as 170/83  Monitor BP . Permissive hypertension (OK if < 220/120) but gradually normalize in 5-7 days . Long-term BP goal  normotensive  Hyperlipidemia  Home meds:  zocor 20  Now on lipitor 80   LDL 110, goal < 70  Continue statin at discharge  Other Stroke Risk Factors  Cigarette smoker, advised to stop smoking  ETOH use,  advised to drink no more than 2 drink(s) a day  Overweight, Body mass index is 29.19 kg/m., recommend weight loss, diet and exercise as appropriate   Family hx stroke (father)  Other Active Problems  Hx throat cancer  GERD on PPI   Hospital day # 0  He presented with transient facial droop and left leg weakness likely recurrent right brain subcortical TIA.  He has had a fluctuating course.  He signed consent and participate in the BMS strokes study.  Study specific MRI confirmed small right thalamic infarct which was not seen on the initial MRI performed earlier yesterday..  Check echocardiogram,   he appears to be at risk for sleep apnea and may benefit with outpatient referral for sleep study.  Long discussion with patient, wife and Dr. Reesa Chew and answered questions.  Patient may be discharged today and follow-up as per stroke research clinic BMS study protocol.  Greater than 50% time during this 25-minute visit was spent in counseling and coordination of care and discussion about his stroke and suspected sleep apnea and answering questions  Antony Contras, MD  To contact Stroke Continuity provider, please refer to [email or web address] After hours, contact General Neurology

## 2019-12-04 NOTE — Progress Notes (Signed)
  Echocardiogram 2D Echocardiogram has been performed.  Andrew Mendoza 12/04/2019, 2:39 PM

## 2019-12-04 NOTE — Progress Notes (Signed)
Pt's HR as low as 39 asymptomatic. Opyd updated on change.

## 2019-12-04 NOTE — Progress Notes (Signed)
Physical Therapy Treatment Patient Details Name: Andrew Mendoza MRN: [id number] DOB: 06/19/1955 Today's Date: 12/04/2019    History of Present Illness 64 yo male with onset of TIA with L side weakness was referred to PT in ED.  Has normal EEG with no seizure activity, no changes on MRI of head.  PMHx:  lacunar and L lentiform nucleus, L thalamus infarcts, HLD, HTN, GERD, throat CA50 yo male with onset of TIA with L side weakness was referred to PT in ED.  Has normal EEG with no seizure activity, no changes on MRI of head.  PMHx:  lacunar and L lentiform nucleus, L thalamus infarcts, HLD, HTN, GERD, throat CA35 yo male with onset of TIA with L side weakness was referred to PT in ED.  Has normal EEG with no seizure activity, no changes on MRI of head.  PMHx:  lacunar and L lentiform nucleus, L thalamus infarcts, HLD, HTN, GERD, throat CA    PT Comments    Pt has met all goals.  No further PT needs.  Stroke education completed.    Follow Up Recommendations  No PT follow up     Equipment Recommendations  None recommended by PT    Recommendations for Other Services       Precautions / Restrictions Precautions Precautions: None Precaution Comments: L side weakness    Mobility  Bed Mobility Overal bed mobility: Modified Independent                Transfers Overall transfer level: Modified independent                  Ambulation/Gait Ambulation/Gait assistance: Modified independent (Device/Increase time);Independent Gait Distance (Feet): 400 Feet Assistive device: None Gait Pattern/deviations: Step-through pattern   Gait velocity interpretation: >2.62 ft/sec, indicative of community ambulatory General Gait Details: moderate to significant challenge without LOB, but with episodes of mild deviation with significant challenge like braiding/sidestepping.   Stairs Stairs: Yes Stairs assistance: Modified independent (Device/Increase time) Stair Management: One rail  Left;Alternating pattern;Forwards Number of Stairs: 5 General stair comments: safe with rail, can complete without rail   Wheelchair Mobility    Modified Rankin (Stroke Patients Only) Modified Rankin (Stroke Patients Only) Modified Rankin: No significant disability     Balance Overall balance assessment: Needs assistance Sitting-balance support: Feet supported Sitting balance-Leahy Scale: Good     Standing balance support: No upper extremity supported;During functional activity Standing balance-Leahy Scale: Good                   Standardized Balance Assessment Standardized Balance Assessment : Dynamic Gait Index   Dynamic Gait Index Level Surface: Normal Change in Gait Speed: Normal Gait with Horizontal Head Turns: Normal Gait with Vertical Head Turns: Normal Gait and Pivot Turn: Normal Step Over Obstacle: Normal Step Around Obstacles: Normal Steps: Mild Impairment Total Score: 23      Cognition Arousal/Alertness: Awake/alert Behavior During Therapy: WFL for tasks assessed/performed Overall Cognitive Status: Within Functional Limits for tasks assessed                                        Exercises      General Comments General comments (skin integrity, edema, etc.): Discusse with pt/wife need to understand and manage risk factors--examples given.  Instructed in BE FAST.  HR rosed to 81 bpm during activity and dropped back to 50 when activity don.  Pertinent Vitals/Pain Pain Assessment: No/denies pain    Home Living                      Prior Function            PT Goals (current goals can now be found in the care plan section) Acute Rehab PT Goals Patient Stated Goal: resolve weakness PT Goal Formulation: With patient Time For Goal Achievement: 12/10/19 Potential to Achieve Goals: Good Progress towards PT goals: Progressing toward goals;Goals met/education completed, patient discharged from PT     Frequency    Min 3X/week      PT Plan Current plan remains appropriate    Co-evaluation              AM-PAC PT "6 Clicks" Mobility   Outcome Measure  Help needed turning from your back to your side while in a flat bed without using bedrails?: None Help needed moving from lying on your back to sitting on the side of a flat bed without using bedrails?: None Help needed moving to and from a bed to a chair (including a wheelchair)?: None Help needed standing up from a chair using your arms (e.g., wheelchair or bedside chair)?: None Help needed to walk in hospital room?: None Help needed climbing 3-5 steps with a railing? : None 6 Click Score: 24    End of Session   Activity Tolerance: Patient tolerated treatment well;Treatment limited secondary to medical complications (Comment) Patient left: in bed;with call bell/phone within reach Nurse Communication: Mobility status PT Visit Diagnosis: Other abnormalities of gait and mobility (R26.89);Other symptoms and signs involving the nervous system (R29.898)     Time: 4967-5916 PT Time Calculation (min) (ACUTE ONLY): 31 min  Charges:  $Gait Training: 8-22 mins $Therapeutic Activity: 8-22 mins                     12/04/2019  Ginger Carne., PT Acute Rehabilitation Services [phone]  (pager) [phone]  (office)   Tessie Fass  12/04/2019, 11:50 AM

## 2019-12-04 NOTE — TOC Transition Note (Signed)
Transition of Care [facility]) - CM/SW Discharge Note   Patient Details  Name: Andrew Mendoza MRN: [id number] Date of Birth: 1955-07-11  Transition of Care [facility]) CM/SW Contact:  Pollie Friar, RN Phone Number: 12/04/2019, 2:13 PM   Clinical Narrative:    Pt discharging home. No f/u per PT. Pt has transportation home.    Final next level of care: Home/Self Care Barriers to Discharge: No Barriers Identified   Patient Goals and CMS Choice        Discharge Placement                       Discharge Plan and Services                                     Social Determinants of Health (SDOH) Interventions     Readmission Risk Interventions No flowsheet data found.

## 2019-12-04 NOTE — Progress Notes (Signed)
Pt's wife noticed pt HR going to 35 and pt gasping for air. Pt AOx4, lungs clear, and no c/o pain. Dr. Myna Hidalgo updated.

## 2019-12-04 NOTE — Progress Notes (Signed)
This writer notified Reesa Chew, MD of Andrew Mendoza. Notified him of Andrew Mendoza residing in the 40's and that it had been this way throughout the night. MD states that he will continue to monitor the Andrew Mendoza and he will await the results of his ECHO.

## 2019-12-25 ENCOUNTER — Other Ambulatory Visit: Payer: Self-pay | Admitting: Neurology

## 2019-12-25 DIAGNOSIS — I639 Cerebral infarction, unspecified: Secondary | ICD-10-CM

## 2019-12-26 DIAGNOSIS — Z0289 Encounter for other administrative examinations: Secondary | ICD-10-CM

## 2020-01-13 ENCOUNTER — Telehealth: Payer: Self-pay | Admitting: *Deleted

## 2020-01-13 NOTE — Telephone Encounter (Signed)
I called pt not able to reach pt. Pt not seen @ [facility]

## 2020-02-05 ENCOUNTER — Telehealth: Payer: Self-pay | Admitting: *Deleted

## 2020-02-05 NOTE — Telephone Encounter (Signed)
The pt returned my call. He stated he filed for short term disability when he was in the hospital because he wasn't sure how long he would be out. His PCP did not take him out of work. Pt stated he was out of work for 3 weeks. He stated he saw Dr Leonie Man in the office on 12/23/19, was cleared to work and returned on 12/25/19. 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 I let the pt know we did not have any office visits on file for the pt and actually saw where he declined a neurology follow up. I am unsure the pt realized this outpatient visit was separate than the research he may have been apart of. I advised pt I would call him back after d/w Dr Leonie Man.

## 2020-02-05 NOTE — Telephone Encounter (Signed)
Spoke with Dr Leonie Man. Form completed for pt for the time period he was out of work. 12/02/19-12/23/19. Form signed and sent to medical records for processing. Spoke with pt and advised him of this. He verbalized appreciation. He has an upcoming appt with research for his 90 day follow up. Pt asked if the disability form could be emailed to him at [email or web address] and he also asked to have the form faxed to [place].

## 2020-02-05 NOTE — Telephone Encounter (Signed)
We received disability forms for the pt. He was seen by neurology during hospital admission but has not followed up in the outpatient neurology clinic. I spoke with Dr Leonie Man. The forms cannot be filled out. If his PCP took him out of work, they will need to complete the form. I also checked the referral. Our office reached out to him to schedule his f/u but the pt declined to schedule on 12/16/19.   I called the pt and LVM asking for call back. We will be able to refund his money.

## 2020-03-02 ENCOUNTER — Ambulatory Visit (HOSPITAL_COMMUNITY): Payer: Self-pay

## 2020-03-16 ENCOUNTER — Other Ambulatory Visit: Payer: Self-pay

## 2020-03-16 ENCOUNTER — Ambulatory Visit (HOSPITAL_COMMUNITY)
Admission: RE | Admit: 2020-03-16 | Discharge: 2020-03-16 | Disposition: A | Discharge status: Home / Self Care | Payer: Self-pay | Source: Ambulatory Visit | Attending: Neurology | Admitting: Neurology

## 2020-03-16 IMAGING — MR MR HEAD W/O CM
6 series · 48 of 48 positions shown · non-contrast
Comparison: MRI of the brain [DATE]

CLINICAL DATA: Stroke follow-up

EXAM:
MRI HEAD WITHOUT CONTRAST
TECHNIQUE: Multiplanar, multiecho pulse sequences of the brain and surrounding
structures were obtained without intravenous contrast.

[Series 2: DWI · axial · 5.0mm · 0.94mm/px · z∈[-102,+43]mm · 13 of 60 slices shown]
[im 1/60]
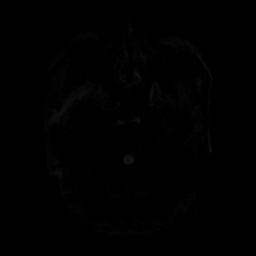
[im 5/60]
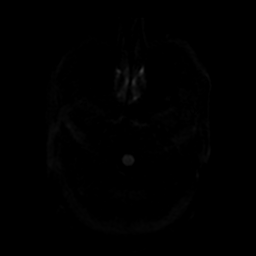
[im 10/60]
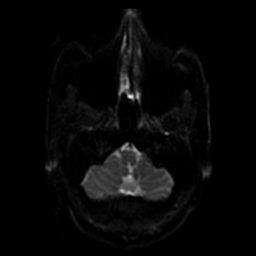
[im 15/60]
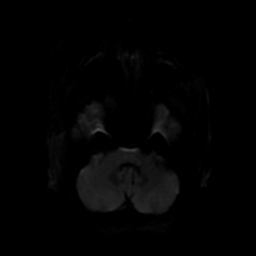
[im 20/60]
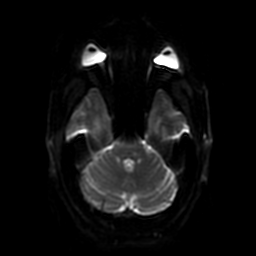
[im 25/60]
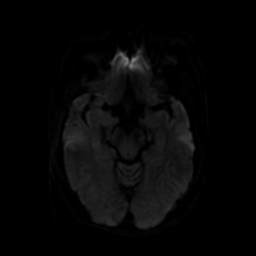
[im 30/60]
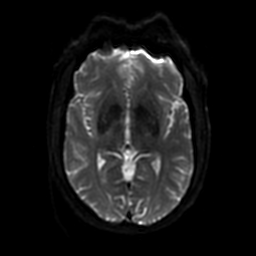
[im 35/60]
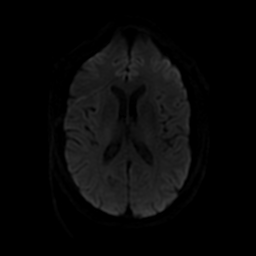
[im 40/60]
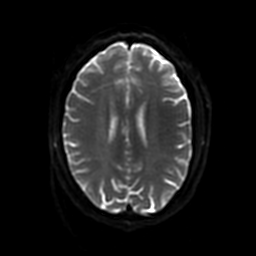
[im 45/60]
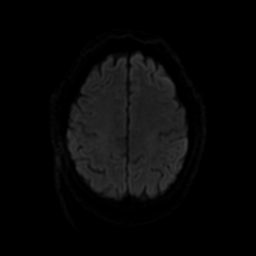
[im 50/60]
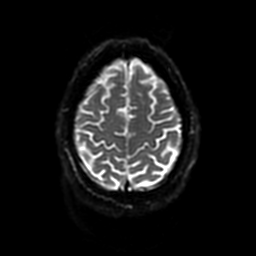
[im 55/60]
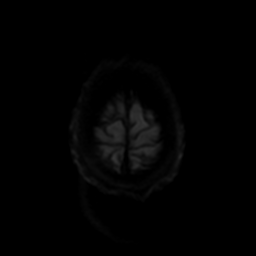
[im 60/60]
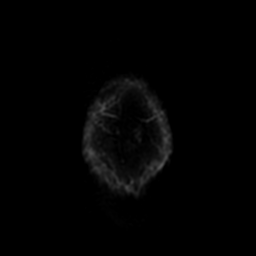

[Series 3: FLAIR · axial · 5.0mm · 0.94mm/px · z∈[-102,+43]mm · 7 of 30 slices shown]
[im 1/30]
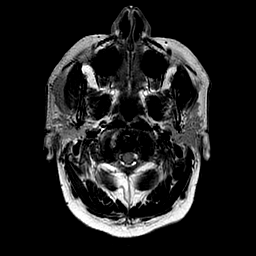
[im 5/30]
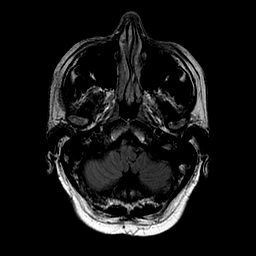
[im 10/30]
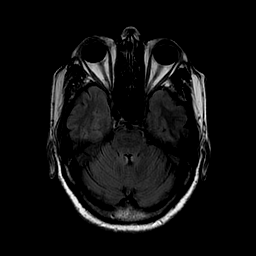
[im 15/30]
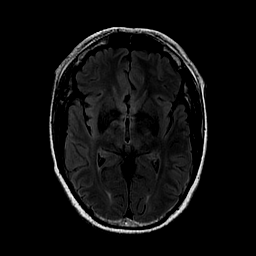
[im 20/30]
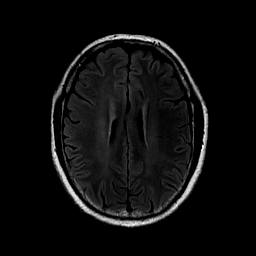
[im 25/30]
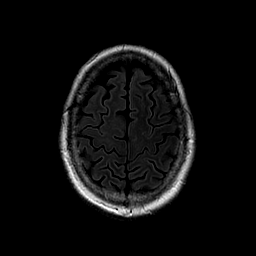
[im 30/30]
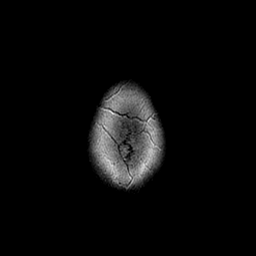

[Series 4: T2-star · axial · 5.0mm · 0.94mm/px · z∈[-102,+43]mm · 7 of 30 slices shown]
[im 1/30]
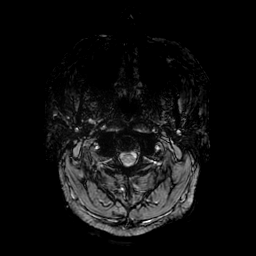
[im 5/30]
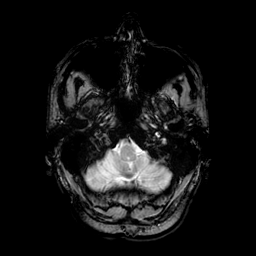
[im 10/30]
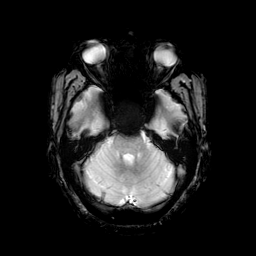
[im 15/30]
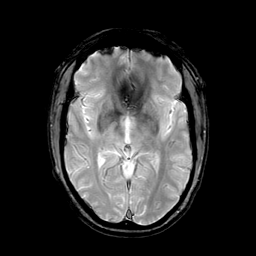
[im 20/30]
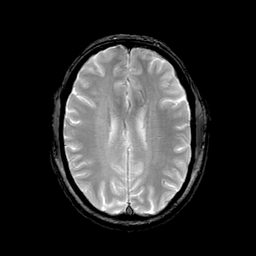
[im 25/30]
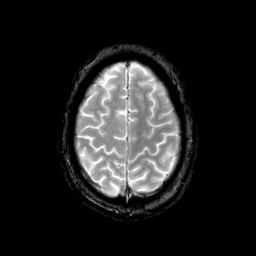
[im 30/30]
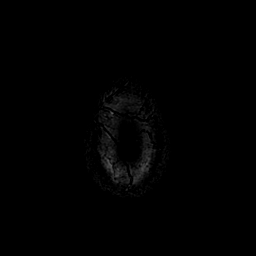

[Series 5: T1 · axial · 5.0mm · 0.94mm/px · z∈[-102,+43]mm · 7 of 30 slices shown]
[im 1/30]
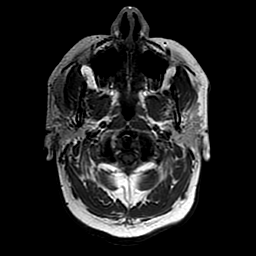
[im 5/30]
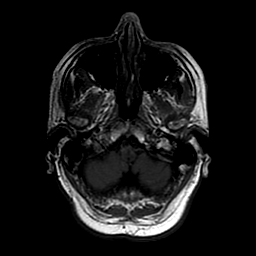
[im 10/30]
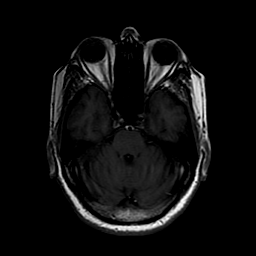
[im 15/30]
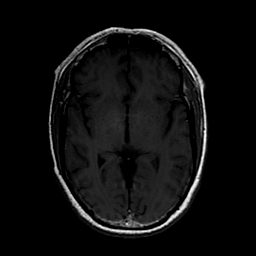
[im 20/30]
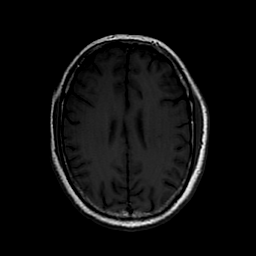
[im 25/30]
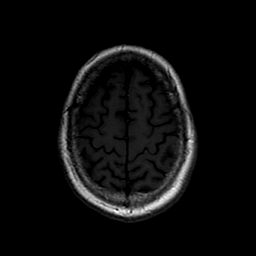
[im 30/30]
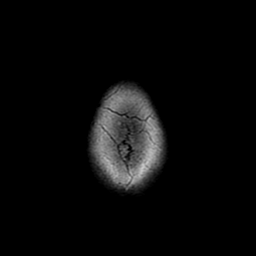

[Series 250: ADC · axial · 5.0mm · 0.94mm/px · z∈[-102,+43]mm · 7 of 29 slices shown]
[im 1/29]
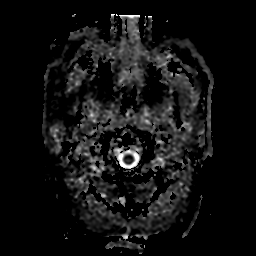
[im 5/29]
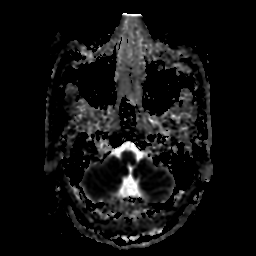
[im 10/29]
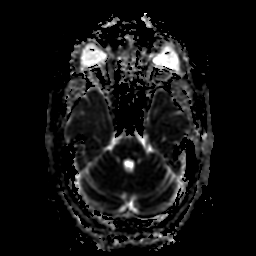
[im 15/29]
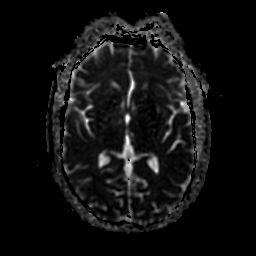
[im 19/29]
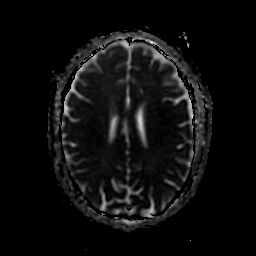
[im 24/29]
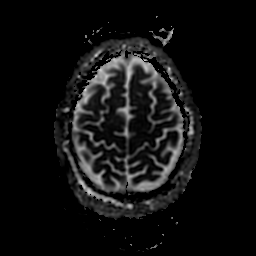
[im 29/29]
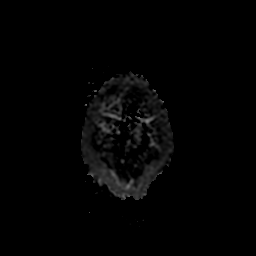

[Series 251: eadc(no-q) · axial · 5.0mm · 0.94mm/px · z∈[-102,+43]mm · 7 of 30 slices shown]
[im 1/30]
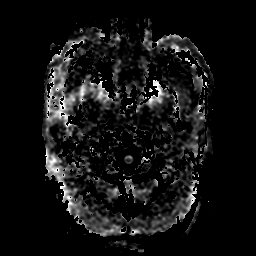
[im 5/30]
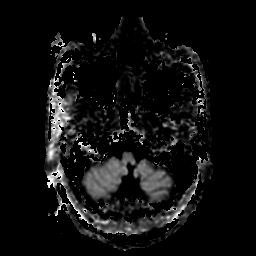
[im 10/30]
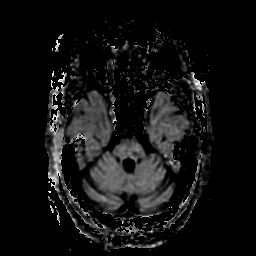
[im 15/30]
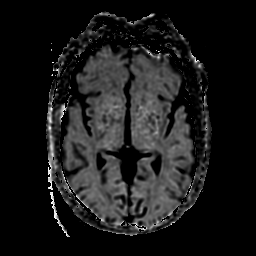
[im 20/30]
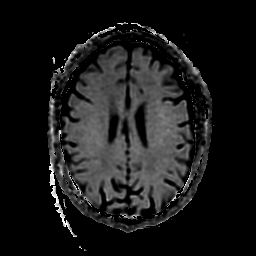
[im 25/30]
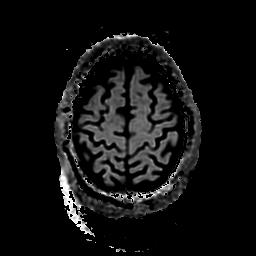
[im 30/30]
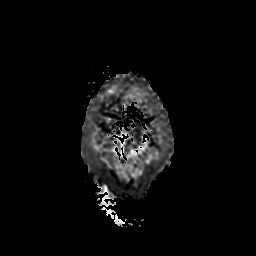

[48 of 48 positions shown; findings below may reference images not displayed]

FINDINGS: Brain: No acute infarction, hemorrhage, hydrocephalus, extra-axial
collection or mass lesion.

Remote infarct within the anterior right thalamus. Foci of T2
hyperintense within the right frontal lobe and left external
capsule, nonspecific.

Vascular: Normal flow voids.

Skull and upper cervical spine: Normal marrow signal.

Sinuses/Orbits: Negative.

Other: None.
IMPRESSION: 1. No acute intracranial abnormality.
2. Remote infarct within the anterior right thalamus.
3. Foci of T2 hyperintense within the right frontal lobe and left
external capsule, nonspecific, may represent early microangiopathic
changes.

## 2020-03-17 NOTE — Progress Notes (Signed)
Kindly inform the patient that stroke study specific MRI scan of the brain showed expected findings of his recent stroke on the right side in the deep portion of the brain.  No new, unexpected or worrisome findings.

## 2020-05-20 ENCOUNTER — Other Ambulatory Visit: Payer: No Typology Code available for payment source

## 2020-05-20 ENCOUNTER — Other Ambulatory Visit: Payer: Self-pay

## 2020-05-20 DIAGNOSIS — Z20822 Contact with and (suspected) exposure to covid-19: Secondary | ICD-10-CM

## 2020-05-21 LAB — NOVEL CORONAVIRUS, NAA: SARS-CoV-2, NAA: NOT DETECTED

## 2020-05-21 LAB — SARS-COV-2, NAA 2 DAY TAT

## 2021-08-23 DIAGNOSIS — E785 Hyperlipidemia, unspecified: Secondary | ICD-10-CM | POA: Diagnosis not present

## 2021-08-23 DIAGNOSIS — Z125 Encounter for screening for malignant neoplasm of prostate: Secondary | ICD-10-CM | POA: Diagnosis not present

## 2021-08-23 DIAGNOSIS — R5383 Other fatigue: Secondary | ICD-10-CM | POA: Diagnosis not present

## 2021-08-23 DIAGNOSIS — N529 Male erectile dysfunction, unspecified: Secondary | ICD-10-CM | POA: Diagnosis not present

## 2021-08-23 DIAGNOSIS — Z0000 Encounter for general adult medical examination without abnormal findings: Secondary | ICD-10-CM | POA: Diagnosis not present

## 2021-08-23 DIAGNOSIS — E039 Hypothyroidism, unspecified: Secondary | ICD-10-CM | POA: Diagnosis not present

## 2021-08-23 DIAGNOSIS — H9193 Unspecified hearing loss, bilateral: Secondary | ICD-10-CM | POA: Diagnosis not present

## 2021-08-23 DIAGNOSIS — R5381 Other malaise: Secondary | ICD-10-CM | POA: Diagnosis not present

## 2021-10-12 DIAGNOSIS — E039 Hypothyroidism, unspecified: Secondary | ICD-10-CM | POA: Diagnosis not present

## 2021-10-19 DIAGNOSIS — G4733 Obstructive sleep apnea (adult) (pediatric): Secondary | ICD-10-CM | POA: Diagnosis not present

## 2022-01-13 DIAGNOSIS — G4733 Obstructive sleep apnea (adult) (pediatric): Secondary | ICD-10-CM | POA: Diagnosis not present

## 2022-02-28 DIAGNOSIS — C140 Malignant neoplasm of pharynx, unspecified: Secondary | ICD-10-CM | POA: Diagnosis not present

## 2022-02-28 DIAGNOSIS — R5383 Other fatigue: Secondary | ICD-10-CM | POA: Diagnosis not present

## 2022-02-28 DIAGNOSIS — E291 Testicular hypofunction: Secondary | ICD-10-CM | POA: Diagnosis not present

## 2022-02-28 DIAGNOSIS — E039 Hypothyroidism, unspecified: Secondary | ICD-10-CM | POA: Diagnosis not present

## 2022-02-28 DIAGNOSIS — G4733 Obstructive sleep apnea (adult) (pediatric): Secondary | ICD-10-CM | POA: Diagnosis not present

## 2022-02-28 DIAGNOSIS — R5381 Other malaise: Secondary | ICD-10-CM | POA: Diagnosis not present

## 2022-02-28 DIAGNOSIS — I679 Cerebrovascular disease, unspecified: Secondary | ICD-10-CM | POA: Diagnosis not present

## 2022-09-05 DIAGNOSIS — M25551 Pain in right hip: Secondary | ICD-10-CM | POA: Diagnosis not present

## 2022-09-05 DIAGNOSIS — M79661 Pain in right lower leg: Secondary | ICD-10-CM | POA: Diagnosis not present

## 2022-09-05 DIAGNOSIS — Z0000 Encounter for general adult medical examination without abnormal findings: Secondary | ICD-10-CM | POA: Diagnosis not present

## 2022-09-05 DIAGNOSIS — R7989 Other specified abnormal findings of blood chemistry: Secondary | ICD-10-CM | POA: Diagnosis not present

## 2022-09-05 DIAGNOSIS — Z125 Encounter for screening for malignant neoplasm of prostate: Secondary | ICD-10-CM | POA: Diagnosis not present

## 2022-09-05 DIAGNOSIS — E785 Hyperlipidemia, unspecified: Secondary | ICD-10-CM | POA: Diagnosis not present

## 2022-09-05 DIAGNOSIS — E039 Hypothyroidism, unspecified: Secondary | ICD-10-CM | POA: Diagnosis not present

## 2022-09-05 DIAGNOSIS — C140 Malignant neoplasm of pharynx, unspecified: Secondary | ICD-10-CM | POA: Diagnosis not present

## 2022-09-05 DIAGNOSIS — F329 Major depressive disorder, single episode, unspecified: Secondary | ICD-10-CM | POA: Diagnosis not present

## 2022-09-06 DIAGNOSIS — M5431 Sciatica, right side: Secondary | ICD-10-CM | POA: Diagnosis not present

## 2022-09-06 DIAGNOSIS — M5417 Radiculopathy, lumbosacral region: Secondary | ICD-10-CM | POA: Diagnosis not present

## 2022-09-06 DIAGNOSIS — M9905 Segmental and somatic dysfunction of pelvic region: Secondary | ICD-10-CM | POA: Diagnosis not present

## 2022-09-06 DIAGNOSIS — M9903 Segmental and somatic dysfunction of lumbar region: Secondary | ICD-10-CM | POA: Diagnosis not present

## 2022-09-08 DIAGNOSIS — M5431 Sciatica, right side: Secondary | ICD-10-CM | POA: Diagnosis not present

## 2022-09-08 DIAGNOSIS — M5417 Radiculopathy, lumbosacral region: Secondary | ICD-10-CM | POA: Diagnosis not present

## 2022-09-08 DIAGNOSIS — M9903 Segmental and somatic dysfunction of lumbar region: Secondary | ICD-10-CM | POA: Diagnosis not present

## 2022-09-08 DIAGNOSIS — M9905 Segmental and somatic dysfunction of pelvic region: Secondary | ICD-10-CM | POA: Diagnosis not present

## 2022-09-12 ENCOUNTER — Emergency Department (HOSPITAL_COMMUNITY): Payer: PPO

## 2022-09-12 ENCOUNTER — Emergency Department (HOSPITAL_COMMUNITY)
Admission: EM | Admit: 2022-09-12 | Discharge: 2022-09-12 | Disposition: A | Discharge status: Home / Self Care | Payer: PPO | Attending: Emergency Medicine | Admitting: Emergency Medicine

## 2022-09-12 DIAGNOSIS — I6381 Other cerebral infarction due to occlusion or stenosis of small artery: Secondary | ICD-10-CM | POA: Insufficient documentation

## 2022-09-12 DIAGNOSIS — I10 Essential (primary) hypertension: Secondary | ICD-10-CM | POA: Insufficient documentation

## 2022-09-12 DIAGNOSIS — Z8521 Personal history of malignant neoplasm of larynx: Secondary | ICD-10-CM | POA: Diagnosis not present

## 2022-09-12 DIAGNOSIS — Z79899 Other long term (current) drug therapy: Secondary | ICD-10-CM | POA: Insufficient documentation

## 2022-09-12 DIAGNOSIS — I6990 Unspecified sequelae of unspecified cerebrovascular disease: Secondary | ICD-10-CM | POA: Diagnosis not present

## 2022-09-12 DIAGNOSIS — G4710 Hypersomnia, unspecified: Secondary | ICD-10-CM | POA: Insufficient documentation

## 2022-09-12 DIAGNOSIS — R413 Other amnesia: Secondary | ICD-10-CM | POA: Diagnosis not present

## 2022-09-12 DIAGNOSIS — R5383 Other fatigue: Secondary | ICD-10-CM | POA: Insufficient documentation

## 2022-09-12 DIAGNOSIS — R410 Disorientation, unspecified: Secondary | ICD-10-CM | POA: Diagnosis not present

## 2022-09-12 DIAGNOSIS — R4189 Other symptoms and signs involving cognitive functions and awareness: Secondary | ICD-10-CM | POA: Diagnosis not present

## 2022-09-12 DIAGNOSIS — R4182 Altered mental status, unspecified: Secondary | ICD-10-CM | POA: Diagnosis not present

## 2022-09-12 LAB — COMPREHENSIVE METABOLIC PANEL
ALT: 27 U/L (ref 0–44)
AST: 23 U/L (ref 15–41)
Albumin: 4 g/dL (ref 3.5–5.0)
Alkaline Phosphatase: 85 U/L (ref 38–126)
Anion gap: 11 (ref 5–15)
BUN: 13 mg/dL (ref 8–23)
CO2: 24 mmol/L (ref 22–32)
Calcium: 9.2 mg/dL (ref 8.9–10.3)
Chloride: 103 mmol/L (ref 98–111)
Creatinine, Ser: 0.98 mg/dL (ref 0.61–1.24)
GFR, Estimated: >60 mL/min (ref >60)
Glucose, Bld: 107 mg/dL — ABNORMAL HIGH (ref 70–99)
Potassium: 4 mmol/L (ref 3.5–5.1)
Sodium: 138 mmol/L (ref 135–145)
Total Bilirubin: 1.1 mg/dL (ref 0.3–1.2)
Total Protein: 7.3 g/dL (ref 6.5–8.1)

## 2022-09-12 LAB — URINALYSIS, ROUTINE W REFLEX MICROSCOPIC
Bilirubin Urine: NEGATIVE
Glucose, UA: NEGATIVE mg/dL
Hgb urine dipstick: NEGATIVE
Ketones, ur: 5 mg/dL — AB
Leukocytes,Ua: NEGATIVE
Nitrite: NEGATIVE
Protein, ur: NEGATIVE mg/dL
Specific Gravity, Urine: 1.021 (ref 1.005–1.030)
pH: 5 (ref 5.0–8.0)

## 2022-09-12 LAB — CBC
HCT: 45.5 % (ref 39.0–52.0)
Hemoglobin: 15.6 g/dL (ref 13.0–17.0)
MCH: 31.1 pg (ref 26.0–34.0)
MCHC: 34.3 g/dL (ref 30.0–36.0)
MCV: 90.6 fL (ref 80.0–100.0)
Platelets: 270 10*3/uL (ref 150–400)
RBC: 5.02 MIL/uL (ref 4.22–5.81)
RDW: 12.5 % (ref 11.5–15.5)
WBC: 12.3 10*3/uL — ABNORMAL HIGH (ref 4.0–10.5)
nRBC: 0 % (ref 0.0–0.2)

## 2022-09-12 LAB — RAPID URINE DRUG SCREEN, HOSP PERFORMED
Amphetamines: NOT DETECTED
Barbiturates: NOT DETECTED
Benzodiazepines: NOT DETECTED
Cocaine: NOT DETECTED
Opiates: NOT DETECTED
Tetrahydrocannabinol: NOT DETECTED

## 2022-09-12 MED ORDER — GADOBUTROL 1 MMOL/ML IV SOLN
9.0000 mL | Freq: Once | INTRAVENOUS | Status: AC | PRN
  Administered 2022-09-12: 9 mL via INTRAVENOUS

## 2022-09-12 MED ORDER — LISINOPRIL 10 MG PO TABS: 10.0000 mg | ORAL_TABLET | Freq: Every day | ORAL | 2 refills | Status: AC

## 2022-09-12 MED ORDER — LACTATED RINGERS IV BOLUS
1000.0000 mL | Freq: Once | INTRAVENOUS | Status: AC
  Administered 2022-09-12: 1000 mL via INTRAVENOUS

## 2022-09-12 MED ORDER — LISINOPRIL 10 MG PO TABS
10.0000 mg | ORAL_TABLET | Freq: Once | ORAL | Status: AC
  Administered 2022-09-12: 10 mg via ORAL
  Filled 2022-09-12: qty 1

## 2022-09-12 MED ORDER — LABETALOL HCL 5 MG/ML IV SOLN
5.0000 mg | Freq: Once | INTRAVENOUS | Status: AC
  Administered 2022-09-12: 5 mg via INTRAVENOUS
  Filled 2022-09-12: qty 4

## 2022-09-12 NOTE — Discharge Instructions (Addendum)
Check your blood pressures at home.  If they remain elevated continue taking the blood pressure medication that was prescribed to your pharmacy.  Call the neurology office to obtain close follow-up if possible.  Return to the emergency department for any new or worsening symptoms of concern.

## 2022-09-12 NOTE — ED Notes (Signed)
Pt transported to MRI 

## 2022-09-12 NOTE — ED Provider Notes (Signed)
Care of patient assumed from Dr. Rubin Payor.  This patient with history of CVA with residual left-sided weakness and increased somnolence, presents for confusion.  He is currently awaiting an MRI.  If negative, can be discharged with neurology follow-up. Physical Exam  BP (!) 183/92   Pulse (!) 57   Temp 98.2 F (36.8 C)   Resp 13   Ht 5\' 8"  (1.727 m)   Wt 90.7 kg   SpO2 99%   BMI 30.41 kg/m   Physical Exam Vitals and nursing note reviewed.  Constitutional:      General: He is not in acute distress.    Appearance: Normal appearance. He is well-developed. He is not ill-appearing, toxic-appearing or diaphoretic.  HENT:     Head: Normocephalic and atraumatic.     Right Ear: External ear normal.     Left Ear: External ear normal.     Nose: Nose normal.     Mouth/Throat:     Mouth: Mucous membranes are moist.  Eyes:     Extraocular Movements: Extraocular movements intact.     Conjunctiva/sclera: Conjunctivae normal.  Cardiovascular:     Rate and Rhythm: Normal rate and regular rhythm.  Pulmonary:     Effort: Pulmonary effort is normal. No respiratory distress.  Abdominal:     General: There is no distension.     Palpations: Abdomen is soft.     Tenderness: There is no abdominal tenderness.  Musculoskeletal:        General: No swelling. Normal range of motion.     Cervical back: Normal range of motion and neck supple.     Right lower leg: No edema.     Left lower leg: No edema.  Skin:    General: Skin is warm and dry.     Coloration: Skin is not jaundiced or pale.  Neurological:     General: No focal deficit present.     Mental Status: He is alert and oriented to person, place, and time.     Cranial Nerves: No cranial nerve deficit.     Sensory: No sensory deficit.     Motor: No weakness.     Coordination: Coordination normal.  Psychiatric:        Mood and Affect: Mood normal.     Procedures  Procedures  ED Course / MDM    Medical Decision Making Amount and/or  Complexity of Data Reviewed Labs: ordered. Radiology: ordered.  Risk Prescription drug management.   On assessment, patient resting comfortably.  Wife is concerned about elevated blood pressure.  She states that his blood pressure is typically normal without medications.  For now, patient to remain with permissive hypertension pending MRI.  Patient currently denies any symptoms.  He has no evidence of confusion at this time.  Wife agrees.  MRI was negative for acute findings.  Patient was given small dose of labetalol and initiated on lisinopril for hypertension.  He was given a prescription for lisinopril and advised to continue taking if blood pressure remains elevated at home.  Patient is stable for discharge with outpatient neurology follow-up.       Gloris Manchester, MD 09/12/22 2219

## 2022-09-12 NOTE — ED Provider Notes (Signed)
[name] Provider Note   CSN: [id number] Arrival date & time: 09/12/22  1024     History  Chief Complaint  Patient presents with   Fatigue    Andrew Mendoza is a 67 y.o. male.  HPI Patient presents with confusion and increased sleepiness.  Has been confused the last couple days.  Extra sleepiness 2.  Has had some episodes of same.  Has had previous stroke in the past with some left-sided deficits.  Has had hypersomnia with that at x 2.  Had been seen by PCP a week ago.  I reviewed notes and lab work and TSH and T4 were both just mildly elevated.  However patient more confusion.  Reportedly got up yesterday and tried to go to work.  Improving somewhat today however.  No headache.  No dysuria.  No drug use.  Rare alcohol use.  No recent medication changes.   Past Medical History:  Diagnosis Date   GERD (gastroesophageal reflux disease)    History of chicken pox    Hypercholesteremia    Throat cancer (HCC)     Home Medications Prior to Admission medications   Medication Sig Start Date End Date Taking? Authorizing Provider  atorvastatin (LIPITOR) 80 MG tablet Take 1 tablet (80 mg total) by mouth daily. 12/05/19   Amin, Loura Halt, MD  Cholecalciferol (VITAMIN D3) 1.25 MG (50000 UT) CAPS Take 1 capsule by mouth daily.    [provider]  Investigational - Study Medication BMS yesterday 12/04/19   Dimple Nanas, MD  Multiple Vitamin (MULTIVITAMIN) tablet Take 1 tablet by mouth daily.    [provider]  omeprazole (PRILOSEC) 40 MG capsule Take 40 mg by mouth daily.  02/12/13   [provider]  vitamin B-12 (CYANOCOBALAMIN) 1000 MCG tablet Take 1,000 mcg by mouth daily.    [provider]  vitamin E (VITAMIN E) 400 UNIT capsule Take 400 Units by mouth daily.     [provider]      Allergies    Patient has no known allergies.    Review of Systems   Review of Systems  Physical  Exam Updated Vital Signs BP (!) 145/81   Pulse (!) 54   Temp 98.5 F (36.9 C) (Oral)   Resp 14   Ht  (1.727 m)   Wt 90.7 kg   SpO2 100%   BMI 30.41 kg/m  Physical Exam Vitals and nursing note reviewed.  HENT:     Head: Atraumatic.  Eyes:     Pupils: Pupils are equal, round, and reactive to light.  Cardiovascular:     Rate and Rhythm: Regular rhythm.  Skin:    Capillary Refill: Capillary refill takes less than 2 seconds.  Neurological:     Mental Status: He is alert and oriented to person, place, and time.     Comments: May have some mild confusion but moving all extremities.  Good grip strength bilaterally.  Decent strength legs bilaterally.  Able to tell me history but mild confusion.     ED Results / Procedures / Treatments   Labs (all labs ordered are listed, but only abnormal results are displayed) Labs Reviewed  CBC - Abnormal; Notable for the following components:      Result Value   WBC 12.3 (*)    All other components within normal limits  COMPREHENSIVE METABOLIC PANEL - Abnormal; Notable for the following components:   Glucose, Bld 107 (*)  All other components within normal limits  URINALYSIS, ROUTINE W REFLEX MICROSCOPIC  RAPID URINE DRUG SCREEN, HOSP PERFORMED    EKG EKG Interpretation  Date/Time:  Monday September 12 2022 10:32:44 EDT Ventricular Rate:  62 PR Interval:  128 QRS Duration: 86 QT Interval:  406 QTC Calculation: 412 R Axis:   97 Text Interpretation: Normal sinus rhythm Rightward axis Borderline ECG When compared with ECG of 04-Dec-2019 04:11,bradycardia improved Confirmed by Benjiman Core [phone]) on 09/12/2022 12:35:01 PM  Radiology DG Chest 2 View  Result Date: 09/12/2022 CLINICAL DATA:  Altered mental status EXAM: CHEST - 2 VIEW COMPARISON:  None FINDINGS: Low lung volumes. Cardiomegaly. No pleural effusion. No pneumothorax. Prominent bilateral interstitial opacities could represent bronchovascular crowding or mild pulmonary  edema. No focal airspace opacity. Vertebral body heights are maintained. No radiographically apparent displaced rib fractures IMPRESSION: 1. Prominent bilateral interstitial opacities could represent bronchovascular crowding or mild pulmonary edema. 2. Cardiomegaly. 3. Low lung volumes. Electronically Signed   By: Lorenza Cambridge M.D.   On: 09/12/2022 13:56   CT HEAD WO CONTRAST ( )  Result Date: 09/12/2022 CLINICAL DATA:  Mental status change, unknown cause. EXAM: CT HEAD WITHOUT CONTRAST TECHNIQUE: Contiguous axial images were obtained from the base of the skull through the vertex without intravenous contrast. RADIATION DOSE REDUCTION: This exam was performed according to the departmental dose-optimization program which includes automated exposure control, adjustment of the mA and/or kV according to patient size and/or use of iterative reconstruction technique. COMPARISON:  Head MRI 03/16/2020 and head CT 12/03/2019 FINDINGS: Brain: There is no evidence of an acute infarct, intracranial hemorrhage, mass, midline shift, or extra-axial fluid collection. The ventricles and sulci are normal. A chronic lacunar infarct is again noted at the anterior aspect of the right thalamus. Vascular: Calcific atherosclerosis at the skull base. No hyperdense vessel. Skull: No acute fracture or suspicious osseous lesion. Sinuses/Orbits: Paranasal sinuses and mastoid air cells are clear. Unremarkable orbits. Other: None. IMPRESSION: 1. No evidence of acute intracranial abnormality. 2. Chronic right thalamic lacunar infarct. Electronically Signed   By: Sebastian Ache M.D.   On: 09/12/2022 13:54    Procedures Procedures    Medications Ordered in ED Medications - No data to display  ED Course/ Medical Decision Making/ A&P                             Medical Decision Making Amount and/or Complexity of Data Reviewed Labs: ordered. Radiology: ordered.   Patient with confusion/mental status change.  Some memory issues  increased sleepiness.  Reviewed notes it appears of had episodes with this in the past.  Previous stroke.  Head CT done reassuring.  Blood work done and overall reassuring.  Mild hypertension.  Blood work does not show clear cause.  White count mildly elevated.  Doubt thyroid off enough to cause symptoms. Head CT reassuring did not show metastatic disease but did show previous stroke.  With some memory issues after previous stroke will do MRI although will do with and without contrast to further evaluate the brain.  Care turned over to Dr. Durwin Nora.        Final Clinical Impression(s) / ED Diagnoses Final diagnoses:  Fatigue, unspecified type  Memory change    Rx / DC Orders ED Discharge Orders     None         Benjiman Core, MD 09/12/22 1614

## 2022-09-12 NOTE — ED Triage Notes (Signed)
Patient brought in by wife for evaluation of fatigue and not being able to answer simple questions like the date and his anniversary. Wife states he has been sleeping for unusually long periods of time during the day since Saturday. Then on Sunday she came home and he was getting dressed to go to work even though he does not work on Sundays. Patient is alert, oriented x4, and in no apparent distress at this time. No dysarthria, no facial droop, no arm or leg drift, patient is ambulating independently with steady gait.

## 2022-09-15 ENCOUNTER — Telehealth: Payer: Self-pay

## 2022-09-15 NOTE — Telephone Encounter (Signed)
        Patient  visited [facility] on 4/15     Telephone encounter attempt :  1st  A HIPAA compliant voice message was left requesting a return call.  Instructed patient to call back      [facility] Guide, [place] [phone] [address], [place], [place] [address] Phone: [phone] Email: [email or web address]       

## 2022-09-16 ENCOUNTER — Telehealth: Payer: Self-pay

## 2022-09-16 NOTE — Telephone Encounter (Signed)
        Patient  visited [facility]    Telephone encounter attempt : 2nd  A HIPAA compliant voice message was left requesting a return call.  Instructed patient to call back .    [facility] Guide, [facility] [phone] [address] [place], [place], [place] [address] Phone: [phone] Email: [email or web address]

## 2022-11-03 DIAGNOSIS — E039 Hypothyroidism, unspecified: Secondary | ICD-10-CM | POA: Diagnosis not present

## 2022-11-21 ENCOUNTER — Encounter: Payer: Self-pay | Admitting: Neurology

## 2022-11-21 ENCOUNTER — Ambulatory Visit: Payer: PPO | Admitting: Neurology

## 2022-11-21 VITALS — BP 103/59 | HR 50 | Ht 68.0 in | Wt 206.0 lb

## 2022-11-21 DIAGNOSIS — G3184 Mild cognitive impairment, so stated: Secondary | ICD-10-CM

## 2022-11-21 DIAGNOSIS — I161 Hypertensive emergency: Secondary | ICD-10-CM | POA: Diagnosis not present

## 2022-11-21 DIAGNOSIS — R410 Disorientation, unspecified: Secondary | ICD-10-CM | POA: Diagnosis not present

## 2022-11-21 DIAGNOSIS — Z8673 Personal history of transient ischemic attack (TIA), and cerebral infarction without residual deficits: Secondary | ICD-10-CM | POA: Diagnosis not present

## 2022-11-21 NOTE — Patient Instructions (Signed)
I had a long d/w patient and his wife about his recent episode of transient confusion disorientation possibly TIA versus hypertensive emergency, history of prior lacunar stroke, mild cognitive impairment, risk for recurrent stroke/TIAs, personally independently reviewed imaging studies and stroke evaluation results and answered questions.Continue aspirin 81 mg daily  for secondary stroke prevention and maintain strict control of hypertension with blood pressure goal below 130/90, diabetes with hemoglobin A1c goal below 6.5% and lipids with LDL cholesterol goal below 70 mg/dL. I also advised the patient to eat a healthy diet with plenty of whole grains, cereals, fruits and vegetables, exercise regularly and maintain ideal body weight .I recommend increasing dose of pravastatin to 80 mg daily.  Follow-up lipid profile checked in 2 months by primary care physician.  CPAP regularly for his sleep.  Encouraged him to increase participation in cognitively challenging activities like solving crossword puzzles, playing glucose.  We also discussed memory compensation strategies.  Followup in the future with me in 6 months or call earlier if necessary  Memory Compensation Strategies  Use "WARM" strategy.  W= write it down  A= associate it  R= repeat it  M= make a mental note  2.   You can keep a Glass blower/designer.  Use a 3-ring notebook with sections for the following: calendar, important names and phone numbers,  medications, doctors' names/phone numbers, lists/reminders, and a section to journal what you did  each day.   3.    Use a calendar to write appointments down.  4.    Write yourself a schedule for the day.  This can be placed on the calendar or in a separate section of the Memory Notebook.  Keeping a  regular schedule can help memory.  5.    Use medication organizer with sections for each day or morning/evening pills.  You may need help loading it  6.    Keep a basket, or pegboard by the  door.  Place items that you need to take out with you in the basket or on the pegboard.  You may also want to  include a message board for reminders.  7.    Use sticky notes.  Place sticky notes with reminders in a place where the task is performed.  For example: " turn off the  stove" placed by the stove, "lock the door" placed on the door at eye level, " take your medications" on  the bathroom mirror or by the place where you normally take your medications.  8.    Use alarms/timers.  Use while cooking to remind yourself to check on food or as a reminder to take your medicine, or as a  reminder to make a call, or as a reminder to perform another task, etc.

## 2022-11-21 NOTE — Progress Notes (Signed)
[facility] [address] [place]. [place] [address] [phone]       OFFICE CONSULT NOTE  Mr. Andrew Mendoza Date of Birth:  Oct 12, 1955 Medical Record Number:  [id number]   Referring MD: Sherald Hess    Reason for Referral: Confusion and TIA    HPI: Andrew Mendoza is a 67 year old pleasant Caucasian male seen today for office consultation visit.  He is accompanied by his wife.  History is obtained from them and review of electronic medical records.  I personally reviewed pertinent available imaging films in PACS.  He has past medical history of hyperlipidemia, throat cancer, gastroesophageal flux disease and chickenpox.  He was seen in the ER on 09/12/2022 with couple of days of increasing sleepiness and confusion disorientation.  His blood pressure was found to be significantly elevated.  His lab work suggested abnormal TSH and T4.  His blood pressure improved with medications and confusion gradually cleared.  CT head showed no acute abnormality and only chronic right thalamic lacunar infarct was noted.  MRI scan of the brain also showed no acute abnormality and old lacunar infarcts are noted in the right thalamus and basal ganglia with changes of chronic small vessel disease.  His PSA from 02/28/2022 was elevated at 6.391 and he has been started on levothyroxine 75 mcg a day.  And yet TSH on 09/05/22: Still elevated at 7.334 and free T4 at 1.16.  Patient denies any slurred speech double vision focal extremity weakness or numbness at that time..Blood pressure has been better controlled but he has been taking atenolol, hydrochlorothiazide and.  Today it is 103/59.  He is tolerating aspirin well without bleeding or bruising.   He does have a prior history of right thalamic infarct in July 2021 when he was seen by me in the hospital.  At that time he presented with capsular warning syndrome like presentation with fluctuating transient episodes of left facial droop and left leg  weakness lasting 10 to 15 minutes.  Initial MRI scan was negative in July 2021 and subsequently study specific repeat MRI in October 2021 showed a right thalamic lacunar infarct within retrospect was not responsible for the patient's symptoms not visualized for some reason on acute MRI in July 2021.Marland Kitchen  Patient participated in the BMS Axiomatic stroke prevention trial which she completed participation.  Is currently on aspirin alone and states he is feeling quite compliant with using his CPAP for his sleep apnea as well.  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  However wife admits that he has had some mild cognitive difficulties and Memory difficulties since his Stroke..  Patient has a family history of Alzheimer's in his mother in her 86s and is worried about it.  He however is quite independent in all activities of daily living.  ROS:   14 system review of systems is positive for memory difficulties, anxiety, sleep apnea all other systems negative  PMH:  Past Medical History:  Diagnosis Date   GERD (gastroesophageal reflux disease)    History of chicken pox    Hypercholesteremia    Throat cancer (HCC)     Social History:  Social History   Socioeconomic History   Marital status: Married    Spouse name: Not on file   Number of children: Not on file   Years of education: Not on file   Highest education level: Not on file  Occupational History   Not on file  Tobacco Use   Smoking status: Former  Packs/day: 1.00    Years: 10.00    Additional pack years: 0.00    Total pack years: 10.00    Types: Cigarettes   Smokeless tobacco: Never  Vaping Use   Vaping Use: Never used  Substance and Sexual Activity   Alcohol use: Yes    Comment: occassional, none last 24hrs   Drug use: No   Sexual activity: Not on file  Other Topics Concern   Not on file  Social History Narrative   Not on file   Social Determinants of Health   Financial Resource Strain: Not on file   Food Insecurity: Not on file  Transportation Needs: Not on file  Physical Activity: Not on file  Stress: Not on file  Social Connections: Not on file  Intimate Partner Violence: Not on file    Medications:   Current Outpatient Medications on File Prior to Visit  Medication Sig Dispense Refill   Acetylcarnitine HCl (ACETYL L-CARNITINE PO) Take 750 mg by mouth daily.     amphetamine-dextroamphetamine (ADDERALL) 10 MG tablet Take 10 mg by mouth 2 (two) times daily.     aspirin EC 81 MG tablet Take 81 mg by mouth daily. Swallow whole.     Cholecalciferol (VITAMIN D3) 1.25 MG (50000 UT) CAPS Take 1 capsule by mouth daily.     DHEA 10 MG TABS Take 10 mg by mouth daily.     Docusate Calcium (STOOL SOFTENER PO) Take 1 tablet by mouth daily.     Ginkgo Biloba 120 MG CAPS Take 120 mg by mouth 2 (two) times daily.     hydrochlorothiazide (HYDRODIURIL) 12.5 MG tablet Take 12.5 mg by mouth daily.     L-Tyrosine 500 MG TABS Take 500 mg by mouth daily.     levothyroxine (SYNTHROID) 75 MCG tablet Take 75 mcg by mouth daily before breakfast.     lisinopril (ZESTRIL) 10 MG tablet Take 1 tablet (10 mg total) by mouth daily. 30 tablet 2   MAGNESIUM CITRATE PO Take 150 mg by mouth daily.     Milk Thistle-Turmeric (SILYMARIN PO) Take 1,000 mg by mouth daily.     Multiple Vitamin (MULTIVITAMIN) tablet Take 1 tablet by mouth daily.     niacin 500 MG CR capsule Take 500 mg by mouth at bedtime.     omeprazole (PRILOSEC) 40 MG capsule Take 40 mg by mouth daily.      pravastatin (PRAVACHOL) 40 MG tablet Take 40 mg by mouth daily.     venlafaxine XR (EFFEXOR-XR) 37.5 MG 24 hr capsule Take 37.5 mg by mouth daily.     vitamin B-12 (CYANOCOBALAMIN) 1000 MCG tablet Take 1,000 mcg by mouth daily.     vitamin E (VITAMIN E) 400 UNIT capsule Take 400 Units by mouth daily.      Zinc 30 MG TABS Take 30 mg by mouth daily.     No current facility-administered medications on file prior to visit.    Allergies:  No Known  Allergies  Physical Exam General: well developed, well nourished, seated, in no evident distress Head: head normocephalic and atraumatic.   Neck: supple with no carotid or supraclavicular bruits Cardiovascular: regular rate and rhythm, no murmurs Musculoskeletal: no deformity Skin:  no rash/petichiae Vascular:  Normal pulses all extremities  Neurologic Exam Mental Status: Awake and fully alert. Oriented to place and time. Recent and remote memory intact. Attention span, concentration and fund of knowledge appropriate. Mood and affect appropriate.  Diminished recall 1/3.  Able to name 77  animals which can walk on 4 legs.  Clock drawing 4/4. Cranial Nerves: Fundoscopic exam reveals sharp disc margins. Pupils equal, briskly reactive to light. Extraocular movements full without nystagmus. Visual fields full to confrontation. Hearing intact. Facial sensation intact. Face, tongue, palate moves normally and symmetrically.  Motor: Normal bulk and tone. Normal strength in all tested extremity muscles. Sensory.: intact to touch , pinprick , position and vibratory sensation.  Coordination: Rapid alternating movements normal in all extremities. Finger-to-nose and heel-to-shin performed accurately bilaterally. Gait and Station: Arises from chair without difficulty. Stance is normal. Gait demonstrates normal stride length and balance . Able to heel, toe and tandem walk without difficulty.  Reflexes: 1+ and symmetric. Toes downgoing.   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  Strong family history of Alzheimer's.  Vascular risk factors of hypertension, hyperlipidemia and obstructive sleep apnea.  Patient completed participation in the BMS Axiomatic stroke study. \Recent episode of transient confusion disorientation and elevated blood  pressures in April 2024 of unclear etiology TIA versus hypertensive emergency.     PLAN:I had a long d/w patient and his wife about his recent episode of transient confusion disorientation possibly TIA versus hypertensive emergency, history of prior lacunar stroke, mild cognitive impairment, risk for recurrent stroke/TIAs, personally independently reviewed imaging studies and stroke evaluation results and answered questions.Continue aspirin 81 mg daily  for secondary stroke prevention and maintain strict control of hypertension with blood pressure goal below 130/90, diabetes with hemoglobin A1c goal below 6.5% and lipids with LDL cholesterol goal below 70 mg/dL. I also advised the patient to eat a healthy diet with plenty of whole grains, cereals, fruits and vegetables, exercise regularly and maintain ideal body weight .I recommend increasing dose of pravastatin to 80 mg daily.  Follow-up lipid profile checked in 2 months by primary care physician.  CPAP regularly for his sleep.  Encouraged him to increase participation in cognitively challenging activities like solving crossword puzzles, playing glucose.  We also discussed memory compensation strategies.  Followup in the future with me in 6 months or call earlier if necessary.  Greater than 50% time during this 45-minute consultation visit was spent in counseling and coordination of care about his recent episode of confusion and TIA and lacunar stroke history of Alzheimer's and answering questions  Delia Heady, MD Note: This document was prepared with digital dictation and possible smart phrase technology. Any transcriptional errors that result from this process are unintentional.

## 2022-11-24 ENCOUNTER — Encounter: Payer: Self-pay | Admitting: Neurology

## 2022-11-28 MED ORDER — PRAVASTATIN SODIUM 80 MG PO TABS: 80.0000 mg | ORAL_TABLET | Freq: Every day | ORAL | 3 refills | Status: AC

## 2023-02-13 DIAGNOSIS — M9905 Segmental and somatic dysfunction of pelvic region: Secondary | ICD-10-CM | POA: Diagnosis not present

## 2023-02-13 DIAGNOSIS — M9903 Segmental and somatic dysfunction of lumbar region: Secondary | ICD-10-CM | POA: Diagnosis not present

## 2023-02-13 DIAGNOSIS — M5431 Sciatica, right side: Secondary | ICD-10-CM | POA: Diagnosis not present

## 2023-02-13 DIAGNOSIS — M5417 Radiculopathy, lumbosacral region: Secondary | ICD-10-CM | POA: Diagnosis not present

## 2023-02-15 DIAGNOSIS — M5417 Radiculopathy, lumbosacral region: Secondary | ICD-10-CM | POA: Diagnosis not present

## 2023-02-15 DIAGNOSIS — M5431 Sciatica, right side: Secondary | ICD-10-CM | POA: Diagnosis not present

## 2023-02-15 DIAGNOSIS — M9903 Segmental and somatic dysfunction of lumbar region: Secondary | ICD-10-CM | POA: Diagnosis not present

## 2023-02-15 DIAGNOSIS — M9905 Segmental and somatic dysfunction of pelvic region: Secondary | ICD-10-CM | POA: Diagnosis not present

## 2023-04-06 DIAGNOSIS — I679 Cerebrovascular disease, unspecified: Secondary | ICD-10-CM | POA: Diagnosis not present

## 2023-04-06 DIAGNOSIS — Z125 Encounter for screening for malignant neoplasm of prostate: Secondary | ICD-10-CM | POA: Diagnosis not present

## 2023-04-06 DIAGNOSIS — E785 Hyperlipidemia, unspecified: Secondary | ICD-10-CM | POA: Diagnosis not present

## 2023-04-06 DIAGNOSIS — G4714 Hypersomnia due to medical condition: Secondary | ICD-10-CM | POA: Diagnosis not present

## 2023-04-06 DIAGNOSIS — I10 Essential (primary) hypertension: Secondary | ICD-10-CM | POA: Diagnosis not present

## 2023-04-06 DIAGNOSIS — E039 Hypothyroidism, unspecified: Secondary | ICD-10-CM | POA: Diagnosis not present

## 2023-04-06 DIAGNOSIS — F339 Major depressive disorder, recurrent, unspecified: Secondary | ICD-10-CM | POA: Diagnosis not present

## 2023-06-13 DIAGNOSIS — R5381 Other malaise: Secondary | ICD-10-CM | POA: Diagnosis not present

## 2023-06-13 DIAGNOSIS — R058 Other specified cough: Secondary | ICD-10-CM | POA: Diagnosis not present

## 2023-06-13 DIAGNOSIS — R5383 Other fatigue: Secondary | ICD-10-CM | POA: Diagnosis not present

## 2023-06-13 DIAGNOSIS — F339 Major depressive disorder, recurrent, unspecified: Secondary | ICD-10-CM | POA: Diagnosis not present

## 2023-06-13 DIAGNOSIS — C140 Malignant neoplasm of pharynx, unspecified: Secondary | ICD-10-CM | POA: Diagnosis not present

## 2023-06-14 ENCOUNTER — Telehealth: Payer: Self-pay | Admitting: Neurology

## 2023-06-14 NOTE — Telephone Encounter (Signed)
 Pt cx appt

## 2023-06-19 ENCOUNTER — Ambulatory Visit: Payer: PPO | Admitting: Neurology

## 2023-08-02 DIAGNOSIS — R7989 Other specified abnormal findings of blood chemistry: Secondary | ICD-10-CM | POA: Diagnosis not present

## 2023-09-14 DIAGNOSIS — E039 Hypothyroidism, unspecified: Secondary | ICD-10-CM | POA: Diagnosis not present

## 2023-10-09 DIAGNOSIS — E785 Hyperlipidemia, unspecified: Secondary | ICD-10-CM | POA: Diagnosis not present

## 2023-10-09 DIAGNOSIS — I10 Essential (primary) hypertension: Secondary | ICD-10-CM | POA: Diagnosis not present

## 2023-10-09 DIAGNOSIS — Z125 Encounter for screening for malignant neoplasm of prostate: Secondary | ICD-10-CM | POA: Diagnosis not present

## 2023-10-09 DIAGNOSIS — E291 Testicular hypofunction: Secondary | ICD-10-CM | POA: Diagnosis not present

## 2023-10-09 DIAGNOSIS — E079 Disorder of thyroid, unspecified: Secondary | ICD-10-CM | POA: Diagnosis not present

## 2023-10-09 DIAGNOSIS — Z0000 Encounter for general adult medical examination without abnormal findings: Secondary | ICD-10-CM | POA: Diagnosis not present

## 2023-10-09 DIAGNOSIS — C140 Malignant neoplasm of pharynx, unspecified: Secondary | ICD-10-CM | POA: Diagnosis not present

## 2023-10-09 DIAGNOSIS — F339 Major depressive disorder, recurrent, unspecified: Secondary | ICD-10-CM | POA: Diagnosis not present

## 2023-10-24 DIAGNOSIS — N5201 Erectile dysfunction due to arterial insufficiency: Secondary | ICD-10-CM | POA: Diagnosis not present

## 2023-10-24 DIAGNOSIS — E291 Testicular hypofunction: Secondary | ICD-10-CM | POA: Diagnosis not present

## 2023-10-24 DIAGNOSIS — R972 Elevated prostate specific antigen [PSA]: Secondary | ICD-10-CM | POA: Diagnosis not present

## 2023-11-01 DIAGNOSIS — L570 Actinic keratosis: Secondary | ICD-10-CM | POA: Diagnosis not present

## 2023-11-01 DIAGNOSIS — C44729 Squamous cell carcinoma of skin of left lower limb, including hip: Secondary | ICD-10-CM | POA: Diagnosis not present

## 2023-11-01 DIAGNOSIS — D225 Melanocytic nevi of trunk: Secondary | ICD-10-CM | POA: Diagnosis not present

## 2023-11-01 DIAGNOSIS — D2271 Melanocytic nevi of right lower limb, including hip: Secondary | ICD-10-CM | POA: Diagnosis not present

## 2023-11-01 DIAGNOSIS — L821 Other seborrheic keratosis: Secondary | ICD-10-CM | POA: Diagnosis not present

## 2023-11-01 DIAGNOSIS — D485 Neoplasm of uncertain behavior of skin: Secondary | ICD-10-CM | POA: Diagnosis not present

## 2023-11-01 DIAGNOSIS — D2261 Melanocytic nevi of right upper limb, including shoulder: Secondary | ICD-10-CM | POA: Diagnosis not present

## 2023-11-01 DIAGNOSIS — D2262 Melanocytic nevi of left upper limb, including shoulder: Secondary | ICD-10-CM | POA: Diagnosis not present

## 2023-11-01 DIAGNOSIS — D2272 Melanocytic nevi of left lower limb, including hip: Secondary | ICD-10-CM | POA: Diagnosis not present

## 2023-12-20 DIAGNOSIS — C44729 Squamous cell carcinoma of skin of left lower limb, including hip: Secondary | ICD-10-CM | POA: Diagnosis not present

## 2024-01-09 DIAGNOSIS — M5431 Sciatica, right side: Secondary | ICD-10-CM | POA: Diagnosis not present

## 2024-01-09 DIAGNOSIS — M5417 Radiculopathy, lumbosacral region: Secondary | ICD-10-CM | POA: Diagnosis not present

## 2024-01-09 DIAGNOSIS — M9903 Segmental and somatic dysfunction of lumbar region: Secondary | ICD-10-CM | POA: Diagnosis not present

## 2024-01-09 DIAGNOSIS — M9905 Segmental and somatic dysfunction of pelvic region: Secondary | ICD-10-CM | POA: Diagnosis not present

## 2024-01-15 DIAGNOSIS — M9903 Segmental and somatic dysfunction of lumbar region: Secondary | ICD-10-CM | POA: Diagnosis not present

## 2024-01-15 DIAGNOSIS — M5417 Radiculopathy, lumbosacral region: Secondary | ICD-10-CM | POA: Diagnosis not present

## 2024-01-15 DIAGNOSIS — M5431 Sciatica, right side: Secondary | ICD-10-CM | POA: Diagnosis not present

## 2024-01-15 DIAGNOSIS — M9905 Segmental and somatic dysfunction of pelvic region: Secondary | ICD-10-CM | POA: Diagnosis not present

## 2024-01-16 DIAGNOSIS — M9905 Segmental and somatic dysfunction of pelvic region: Secondary | ICD-10-CM | POA: Diagnosis not present

## 2024-01-16 DIAGNOSIS — M5417 Radiculopathy, lumbosacral region: Secondary | ICD-10-CM | POA: Diagnosis not present

## 2024-01-16 DIAGNOSIS — M9903 Segmental and somatic dysfunction of lumbar region: Secondary | ICD-10-CM | POA: Diagnosis not present

## 2024-01-16 DIAGNOSIS — M5431 Sciatica, right side: Secondary | ICD-10-CM | POA: Diagnosis not present

## 2024-01-17 ENCOUNTER — Emergency Department

## 2024-01-17 ENCOUNTER — Emergency Department
Admission: EM | Admit: 2024-01-17 | Discharge: 2024-01-17 | Disposition: A | Discharge status: Home / Self Care | Source: Ambulatory Visit

## 2024-01-17 ENCOUNTER — Other Ambulatory Visit: Payer: Self-pay

## 2024-01-17 DIAGNOSIS — M47816 Spondylosis without myelopathy or radiculopathy, lumbar region: Secondary | ICD-10-CM | POA: Diagnosis not present

## 2024-01-17 DIAGNOSIS — M5416 Radiculopathy, lumbar region: Secondary | ICD-10-CM | POA: Diagnosis not present

## 2024-01-17 DIAGNOSIS — Z85818 Personal history of malignant neoplasm of other sites of lip, oral cavity, and pharynx: Secondary | ICD-10-CM | POA: Diagnosis not present

## 2024-01-17 DIAGNOSIS — M79604 Pain in right leg: Secondary | ICD-10-CM | POA: Diagnosis not present

## 2024-01-17 DIAGNOSIS — R29898 Other symptoms and signs involving the musculoskeletal system: Secondary | ICD-10-CM | POA: Diagnosis not present

## 2024-01-17 DIAGNOSIS — M5126 Other intervertebral disc displacement, lumbar region: Secondary | ICD-10-CM | POA: Diagnosis not present

## 2024-01-17 DIAGNOSIS — I69359 Hemiplegia and hemiparesis following cerebral infarction affecting unspecified side: Secondary | ICD-10-CM | POA: Diagnosis not present

## 2024-01-17 DIAGNOSIS — I6350 Cerebral infarction due to unspecified occlusion or stenosis of unspecified cerebral artery: Secondary | ICD-10-CM | POA: Diagnosis not present

## 2024-01-17 DIAGNOSIS — M545 Low back pain, unspecified: Secondary | ICD-10-CM | POA: Diagnosis present

## 2024-01-17 DIAGNOSIS — R001 Bradycardia, unspecified: Secondary | ICD-10-CM | POA: Diagnosis not present

## 2024-01-17 LAB — CBC WITH DIFFERENTIAL/PLATELET
Abs Immature Granulocytes: 0.04 K/uL (ref 0.00–0.07)
Basophils Absolute: 0 K/uL (ref 0.0–0.1)
Basophils Relative: 0 %
Eosinophils Absolute: 0.1 K/uL (ref 0.0–0.5)
Eosinophils Relative: 1 %
HCT: 45.2 % (ref 39.0–52.0)
Hemoglobin: 15.8 g/dL (ref 13.0–17.0)
Immature Granulocytes: 0 %
Lymphocytes Relative: 15 %
Lymphs Abs: 1.4 K/uL (ref 0.7–4.0)
MCH: 31.2 pg (ref 26.0–34.0)
MCHC: 35 g/dL (ref 30.0–36.0)
MCV: 89.3 fL (ref 80.0–100.0)
Monocytes Absolute: 1.1 K/uL — ABNORMAL HIGH (ref 0.1–1.0)
Monocytes Relative: 11 %
Neutro Abs: 7.1 K/uL (ref 1.7–7.7)
Neutrophils Relative %: 73 %
Platelets: 274 K/uL (ref 150–400)
RBC: 5.06 MIL/uL (ref 4.22–5.81)
RDW: 12.1 % (ref 11.5–15.5)
WBC: 9.8 K/uL (ref 4.0–10.5)
nRBC: 0 % (ref 0.0–0.2)

## 2024-01-17 LAB — URINALYSIS, COMPLETE (UACMP) WITH MICROSCOPIC
Bilirubin Urine: NEGATIVE
Glucose, UA: NEGATIVE mg/dL
Hgb urine dipstick: NEGATIVE
Ketones, ur: NEGATIVE mg/dL
Leukocytes,Ua: NEGATIVE
Nitrite: NEGATIVE
Protein, ur: NEGATIVE mg/dL
Specific Gravity, Urine: 1.016 (ref 1.005–1.030)
Squamous Epithelial / HPF: 0 /HPF (ref 0–5)
pH: 5 (ref 5.0–8.0)

## 2024-01-17 LAB — BASIC METABOLIC PANEL WITH GFR
Anion gap: 14 (ref 5–15)
BUN: 15 mg/dL (ref 8–23)
CO2: 23 mmol/L (ref 22–32)
Calcium: 9.5 mg/dL (ref 8.9–10.3)
Chloride: 98 mmol/L (ref 98–111)
Creatinine, Ser: 0.94 mg/dL (ref 0.61–1.24)
GFR, Estimated: >60 mL/min (ref >60)
Glucose, Bld: 108 mg/dL — ABNORMAL HIGH (ref 70–99)
Potassium: 3.8 mmol/L (ref 3.5–5.1)
Sodium: 135 mmol/L (ref 135–145)

## 2024-01-17 MED ORDER — GABAPENTIN 300 MG PO CAPS: 300.0000 mg | ORAL_CAPSULE | Freq: Three times a day (TID) | ORAL | 0 refills | Status: AC

## 2024-01-17 MED ORDER — LIDOCAINE 5 % EX PTCH: 1.0000 | MEDICATED_PATCH | CUTANEOUS | 0 refills | Status: AC

## 2024-01-17 MED ORDER — ACETAMINOPHEN 500 MG PO TABS: 1000.0000 mg | ORAL_TABLET | Freq: Four times a day (QID) | ORAL | 2 refills | Status: AC | PRN

## 2024-01-17 MED ORDER — IBUPROFEN 200 MG PO TABS
600.0000 mg | ORAL_TABLET | Freq: Three times a day (TID) | ORAL | 0 refills | Status: AC | PRN
Start: 2024-01-17 — End: 2025-01-16

## 2024-01-17 MED ORDER — IBUPROFEN 600 MG PO TABS
600.0000 mg | ORAL_TABLET | Freq: Once | ORAL | Status: AC
  Administered 2024-01-17: 600 mg via ORAL
  Filled 2024-01-17: qty 1

## 2024-01-17 MED ORDER — ACETAMINOPHEN 500 MG PO TABS
1000.0000 mg | ORAL_TABLET | Freq: Once | ORAL | Status: AC
  Administered 2024-01-17: 1000 mg via ORAL
  Filled 2024-01-17: qty 2

## 2024-01-17 NOTE — ED Provider Notes (Signed)
 [facility] Provider Note    Event Date/Time   First MD Initiated Contact with Patient 01/17/24 1140     (approximate)   History   No chief complaint on file.  C/O right lower back pain radiating down right leg.  Patient reports having 6 falls yesterday due to right leg 'giving out'.  Reports some abrasions to right elbow due to fall.  Denies dysuria or any incontinence.   HPI Andrew Mendoza is a 68 y.o. male PMH prior CVA, throat cancer s/p radiation therapy presents for evaluation of right lower back pain and leg weakness with recurrent falls - Patient had a chiropractic adjustment yesterday, has been having right leg weakness and some discomfort in his thigh since that time - Has reportedly fallen about 6 times.  No head strike or LOC.  Not on blood thinners. - Did have some transient left-sided deficits after his prior stroke which he says was about 4 years ago, no history of right-sided deficits. - No urinary symptoms - No significant midline back pain - No saddle anesthesia, no urinary or fecal incontinence, no fevers       Physical Exam   Triage Vital Signs: ED Triage Vitals  Encounter Vitals Group     BP 01/17/24 1036 113/66     Girls Systolic BP Percentile --      Girls Diastolic BP Percentile --      Boys Systolic BP Percentile --      Boys Diastolic BP Percentile --      Pulse Rate 01/17/24 1035 61     Resp 01/17/24 1035 16     Temp 01/17/24 1035 98.2 F (36.8 C)     Temp Source 01/17/24 1035 Oral     SpO2 01/17/24 1035 97 %     Weight 01/17/24 1036 205 lb 14.6 oz (93.4 kg)     Height --      Head Circumference --      Peak Flow --      Pain Score 01/17/24 1036 0     Pain Loc --      Pain Education --      Exclude from Growth Chart --     Most recent vital signs: Vitals:   01/17/24 1140 01/17/24 1408  BP:    Pulse:    Resp:    Temp:  98.4 F (36.9 C)  SpO2: 100%      General: Awake, no distress.  CV:  Good  peripheral perfusion. RRR, RP 2+ Resp:  Normal effort. CTAB Abd:  No distention. Nontender to deep palpation throughout Neuro:  Aox4, CN II-XII intact, FNF wnl, finger taps fast b/l, 5/5 strength in bilateral finger extension/grip, arm flexion/extension, EHL/FHL. BUE AG 10+ sec no drift, LLE AG 5+ sec no drift, RLE AG 5+ sec w/ drift --appears to be weak in thigh. Ambulates with cautious gait. SILT. Negative Rhomberg. Other:  Negative straight leg raise bilaterally.  No tenderness to palpation throughout bilateral thighs.  Right lower extremity with pedal pulse 2+.     ED Results / Procedures / Treatments   Labs (all labs ordered are listed, but only abnormal results are displayed) Labs Reviewed  BASIC METABOLIC PANEL WITH GFR - Abnormal; Notable for the following components:      Result Value   Glucose, Bld 108 (*)    All other components within normal limits  CBC WITH DIFFERENTIAL/PLATELET - Abnormal; Notable for the following components:   Monocytes Absolute 1.1 (*)  All other components within normal limits  URINALYSIS, COMPLETE (UACMP) WITH MICROSCOPIC - Abnormal; Notable for the following components:   Color, Urine YELLOW (*)    APPearance HAZY (*)    Bacteria, UA RARE (*)    All other components within normal limits     EKG  See ED course below.   RADIOLOGY Radiology interpreted by myself and radiology report reviewed.  No acute pathology identified.  Right L4 pinched nerve.    PROCEDURES:  Critical Care performed: No  Procedures   MEDICATIONS ORDERED IN ED: Medications  acetaminophen  (TYLENOL ) tablet 1,000 mg (1,000 mg Oral Given 01/17/24 1210)  ibuprofen  (ADVIL ) tablet 600 mg (600 mg Oral Given 01/17/24 1210)     IMPRESSION / MDM / ASSESSMENT AND PLAN / ED COURSE  I reviewed the triage vital signs and the nursing notes.                              DDX/MDM/AP: Differential diagnosis includes, but is not limited to, sciatica, MSK strain, consider  lumbar spinal compression, also consider possibility of recurrent CVA.  Do not clinically suspect C or T-spine pathology and, states only had chiropractic intervention on low back.  Doubt urinary pathology.  Plan: - Labs - Tylenol , Motrin  - MRI L-spine, MRI brain  Patient's presentation is most consistent with acute presentation with potential threat to life or bodily function.    ED course below.  Presentation overall consistent with sciatica after MRI brain and MRI L-spine.  Started on Tylenol , Motrin , Lidoderm  patches, gabapentin .  Cane provided.  Plan for PMD follow-up, can consider outpatient PT and escalation to muscle relaxants as needed.  ED return precautions in place.  Patient agrees with plan.  Clinical Course as of 01/17/24 1543  Wed Jan 17, 2024  1211 CBC reviewed, unremarkable [MM]  1231 Ecg = sinus bradycardia, rate 54, no gross ST elevation or depression, no significant repolarization abnormality, normal axis, normal intervals.  No evidence of ischemia nor arrhythmia on my interpretation. [MM]  1248 BMP reviewed, unremarkable [MM]  1510 MRI L spine: IMPRESSION: 1. L3-4 large right subarticular disc extrusion with superior migration, severely narrowing the right lateral recess and displacing the right L4 nerve root. Mild bilateral foraminal stenosis. 2. L4-5 mild facet arthrosis and intermediate-sized disc bulge with endplate spurring, resulting in mild right and moderate left foraminal stenosis. 3. L5-S1 disc bulge with endplate spurring and moderate left foraminal stenosis.   [MM]  1511 MRI Brain: IMPRESSION: 1. No evidence of acute intracranial abnormality. 2. Remote anterior right thalamic infarct.   [MM]  1539 Patient reevaluated, discussed overall reassuring findings.  Presentation today most consistent with sciatica.  Will plan for Tylenol  around-the-clock, short course of Motrin , Lidoderm  patches, gabapentin .  Also recommend follow-up with PMD and  consideration of physical therapy.  Can consider escalation to addition of Flexeril as well, will defer at this time given initiation of gabapentin .  ED return precautions in place.  Patient and family agree with plan. [MM]    Clinical Course User Index [MM] Clarine Ozell LABOR, MD     FINAL CLINICAL IMPRESSION(S) / ED DIAGNOSES   Final diagnoses:  Lumbar radiculopathy     Rx / DC Orders   ED Discharge Orders          Ordered    acetaminophen  (TYLENOL ) 500 MG tablet  Every 6 hours PRN        01/17/24 1541  ibuprofen  (MOTRIN  IB) 200 MG tablet  Every 8 hours PRN        01/17/24 1541    lidocaine  (LIDODERM ) 5 %  Every 24 hours        01/17/24 1541    gabapentin  (NEURONTIN ) 300 MG capsule  3 times daily        01/17/24 1541             Note:  This document was prepared using Dragon voice recognition software and may include unintentional dictation errors.   Clarine Ozell LABOR, MD 01/17/24 [phone]

## 2024-01-17 NOTE — Discharge Instructions (Addendum)
 Your evaluation in the emergency department showed a pinched nerve but I believe is causing your right leg discomfort and intermittent weakness.  I prescribed several medications to help with this including Tylenol , Motrin , gabapentin , and Lidoderm  patches.  Please do follow-up with your primary care provider for reevaluation, and they may refer you to a physical therapist if your symptoms are persistent.  We can also consider escalating to adding muscle relaxants in the outpatient setting if needed as well.  Return to the emergency department with any new or worsening symptoms.

## 2024-01-17 NOTE — ED Notes (Signed)
 Pt sleeping at this time. Daughter sitting with same

## 2024-01-17 NOTE — ED Triage Notes (Signed)
 C/O right lower back pain radiating down right leg.  Patient reports having 6 falls yesterday due to right leg 'giving out'.  Reports some abrasions to right elbow due to fall.  Denies dysuria or any incontinence.

## 2024-01-22 DIAGNOSIS — M5441 Lumbago with sciatica, right side: Secondary | ICD-10-CM | POA: Diagnosis not present

## 2024-01-22 DIAGNOSIS — R937 Abnormal findings on diagnostic imaging of other parts of musculoskeletal system: Secondary | ICD-10-CM | POA: Diagnosis not present

## 2024-04-11 DIAGNOSIS — I679 Cerebrovascular disease, unspecified: Secondary | ICD-10-CM | POA: Diagnosis not present

## 2024-04-11 DIAGNOSIS — Z125 Encounter for screening for malignant neoplasm of prostate: Secondary | ICD-10-CM | POA: Diagnosis not present

## 2024-04-11 DIAGNOSIS — F32A Depression, unspecified: Secondary | ICD-10-CM | POA: Diagnosis not present

## 2024-04-11 DIAGNOSIS — E291 Testicular hypofunction: Secondary | ICD-10-CM | POA: Diagnosis not present

## 2024-04-11 DIAGNOSIS — I10 Essential (primary) hypertension: Secondary | ICD-10-CM | POA: Diagnosis not present

## 2024-04-11 DIAGNOSIS — E079 Disorder of thyroid, unspecified: Secondary | ICD-10-CM | POA: Diagnosis not present

## 2024-05-08 DIAGNOSIS — Z85828 Personal history of other malignant neoplasm of skin: Secondary | ICD-10-CM | POA: Diagnosis not present

## 2024-05-08 DIAGNOSIS — D2261 Melanocytic nevi of right upper limb, including shoulder: Secondary | ICD-10-CM | POA: Diagnosis not present

## 2024-05-08 DIAGNOSIS — D2272 Melanocytic nevi of left lower limb, including hip: Secondary | ICD-10-CM | POA: Diagnosis not present

## 2024-05-08 DIAGNOSIS — D2262 Melanocytic nevi of left upper limb, including shoulder: Secondary | ICD-10-CM | POA: Diagnosis not present

## 2024-05-08 DIAGNOSIS — L570 Actinic keratosis: Secondary | ICD-10-CM | POA: Diagnosis not present

## 2024-05-08 DIAGNOSIS — D225 Melanocytic nevi of trunk: Secondary | ICD-10-CM | POA: Diagnosis not present
